# Patient Record
Sex: Male | Born: 1945 | Race: Black or African American | Hispanic: No | Marital: Married | Smoking: Never smoker
Health system: Southern US, Community
[De-identification: ages and names within clinical notes are randomized; demographics above are authoritative.]

## PROBLEM LIST (undated history)

## (undated) DIAGNOSIS — R351 Nocturia: Secondary | ICD-10-CM

## (undated) DIAGNOSIS — I4510 Unspecified right bundle-branch block: Secondary | ICD-10-CM

## (undated) DIAGNOSIS — M1A9XX0 Chronic gout, unspecified, without tophus (tophi): Secondary | ICD-10-CM

## (undated) DIAGNOSIS — Z973 Presence of spectacles and contact lenses: Secondary | ICD-10-CM

## (undated) DIAGNOSIS — C61 Malignant neoplasm of prostate: Secondary | ICD-10-CM

## (undated) DIAGNOSIS — I10 Essential (primary) hypertension: Secondary | ICD-10-CM

## (undated) DIAGNOSIS — E785 Hyperlipidemia, unspecified: Secondary | ICD-10-CM

## (undated) DIAGNOSIS — D696 Thrombocytopenia, unspecified: Secondary | ICD-10-CM

## (undated) HISTORY — PX: OTHER SURGICAL HISTORY: SHX169

## (undated) HISTORY — DX: Essential (primary) hypertension: I10

## (undated) HISTORY — PX: PROSTATE BIOPSY: SHX241

## (undated) HISTORY — PX: NO PAST SURGERIES: SHX2092

## (undated) HISTORY — DX: Thrombocytopenia, unspecified: D69.6

## (undated) HISTORY — DX: Hyperlipidemia, unspecified: E78.5

---

## 2002-07-14 ENCOUNTER — Ambulatory Visit (HOSPITAL_COMMUNITY): Admission: RE | Admit: 2002-07-14 | Discharge: 2002-07-14 | Payer: Self-pay | Admitting: Gastroenterology

## 2002-07-14 ENCOUNTER — Encounter: Payer: Self-pay | Admitting: Gastroenterology

## 2004-04-25 ENCOUNTER — Encounter: Admission: RE | Admit: 2004-04-25 | Discharge: 2004-04-25 | Payer: Self-pay | Admitting: Internal Medicine

## 2005-04-11 ENCOUNTER — Encounter: Admission: RE | Admit: 2005-04-11 | Discharge: 2005-04-11 | Payer: Self-pay | Admitting: Internal Medicine

## 2006-04-09 ENCOUNTER — Encounter: Admission: RE | Admit: 2006-04-09 | Discharge: 2006-04-09 | Payer: Self-pay | Admitting: Internal Medicine

## 2006-09-23 ENCOUNTER — Emergency Department (HOSPITAL_COMMUNITY): Admission: EM | Admit: 2006-09-23 | Discharge: 2006-09-24 | Payer: Self-pay | Admitting: Emergency Medicine

## 2007-04-16 ENCOUNTER — Encounter: Admission: RE | Admit: 2007-04-16 | Discharge: 2007-04-16 | Payer: Self-pay | Admitting: Internal Medicine

## 2007-06-18 ENCOUNTER — Encounter: Admission: RE | Admit: 2007-06-18 | Discharge: 2007-06-18 | Payer: Self-pay | Admitting: Internal Medicine

## 2008-07-09 ENCOUNTER — Encounter: Admission: RE | Admit: 2008-07-09 | Discharge: 2008-07-09 | Payer: Self-pay | Admitting: Internal Medicine

## 2010-05-17 ENCOUNTER — Other Ambulatory Visit: Payer: Self-pay | Admitting: Internal Medicine

## 2010-05-17 ENCOUNTER — Ambulatory Visit
Admission: RE | Admit: 2010-05-17 | Discharge: 2010-05-17 | Disposition: A | Discharge status: Home / Self Care | Payer: Managed Care, Other (non HMO) | Source: Ambulatory Visit | Attending: Internal Medicine | Admitting: Internal Medicine

## 2010-05-17 DIAGNOSIS — J61 Pneumoconiosis due to asbestos and other mineral fibers: Secondary | ICD-10-CM

## 2010-05-17 DIAGNOSIS — D869 Sarcoidosis, unspecified: Secondary | ICD-10-CM

## 2010-05-20 NOTE — Op Note (Signed)
Danny Duran, Danny Duran                            ACCOUNT NO.:  [id number]   MEDICAL RECORD NO.:  [id number]                   PATIENT TYPE:  AMB   LOCATION:  ENDO                                 FACILITY:  [facility]   PHYSICIAN:  Petra Kuba, M.D.                 DATE OF BIRTH:  1945-06-05   DATE OF PROCEDURE:  07/14/2002  DATE OF DISCHARGE:                                 OPERATIVE REPORT   PROCEDURE PERFORMED:  Colonoscopy   INDICATIONS FOR PROCEDURE:  Screening.   INFORMED CONSENT:  Consent was signed after risks, benefits, methods and  options were thoroughly discussed in the office.   MEDICINES USED:  Demerol 70, Versed 7.   DESCRIPTION OF PROCEDURE:  Rectal inspection was pertinent for external  hemorrhoids, small.  Digital exam was negative.  First the video colonoscope  was inserted, easily advanced to the approximate level of the splenic  flexure but possibly to the mid transverse.  Unfortunately there was looping  at that junction and despite rolling him on his back and rolling him on his  right side, and then back on his left side and abdominal pressure, we were  unable to advance any further.  We elected to slowly withdraw.  No  abnormalities were seen.  Anal rectal pull through and retroflexion were  normal at this juncture.  We went ahead and removed the scope and inserted  the pediatric video adjustable colonoscope and were able to get to the same  place; however, with abdominal pressure this time was able to advance  probably to the hepatic flexure.  Unfortunately looping recurred and despite  rolling him on his back and rolling him on his right side we could not  advance any further, so we elected to withdraw.  The prep was adequate.  No  abnormalities were seen on insertion or on withdrawn with the pediatric  adjustable until we withdrew back to the rectum and then the scope was  retroflexed.  Interesting, we did see a tiny probable hyperplastic appearing  polyp  on retroflexion that we did not see when he was on the left side.  Air  was suctioned and scope removed.  The patient tolerated the procedure fairly  well.  There was no obvious immediate complication.   ENDOSCOPIC DIAGNOSES:  1. Internal and external hemorrhoid.  2. Probable tiny hyperplastic appearing rectal polyp seen on retroflexion on     the right side, not seen on the left.  3. Otherwise within normal limits to questionably the hepatic flexure using     the pediatric adjustable.  We did get to possibly the splenic flexure or     the mid transverse using the regular.    PLAN:  Go ahead and get an air contrast barium enema since he is nice and  clean.  If okay and no problems there would recheck in five years.  Yearly  rectals and guaiacs per Dr. Chilton Si.  Happy to see back sooner p.r.n.  Considerations of using the regular adjustable or using Diprivan for better  sedation in the future.                                               Petra Kuba, M.D.    MEM/MEDQ  D:  07/14/2002  T:  07/14/2002  Job:  [id number]   cc:   Erskine Speed, M.D.  [address]  [place]  [place] [address]  Fax: [phone]

## 2010-10-13 LAB — CBC
MCHC: 33.7
MCV: 89.2
Platelets: 245
RDW: 15.2 — ABNORMAL HIGH

## 2010-10-13 LAB — I-STAT 8, (EC8 V) (CONVERTED LAB)
Bicarbonate: 25.5 — ABNORMAL HIGH
Glucose, Bld: 111 — ABNORMAL HIGH
Potassium: 3.5
TCO2: 27
pCO2, Ven: 37.3 — ABNORMAL LOW
pH, Ven: 7.443 — ABNORMAL HIGH

## 2010-10-13 LAB — DIFFERENTIAL
Basophils Absolute: 0.1
Basophils Relative: 1
Eosinophils Absolute: 0.3
Neutro Abs: 5.8
Neutrophils Relative %: 60

## 2010-10-13 LAB — POCT CARDIAC MARKERS
CKMB, poc: 1.2
Myoglobin, poc: 85.2
Operator id: 151321

## 2010-10-13 LAB — POCT I-STAT CREATININE
Creatinine, Ser: 1.2
Operator id: 151321

## 2010-10-13 LAB — D-DIMER, QUANTITATIVE: D-Dimer, Quant: 0.58 — ABNORMAL HIGH

## 2012-09-06 ENCOUNTER — Ambulatory Visit
Admission: RE | Admit: 2012-09-06 | Discharge: 2012-09-06 | Disposition: A | Discharge status: Home / Self Care | Payer: Managed Care, Other (non HMO) | Source: Ambulatory Visit | Attending: Internal Medicine | Admitting: Internal Medicine

## 2012-09-06 ENCOUNTER — Other Ambulatory Visit: Payer: Self-pay | Admitting: Internal Medicine

## 2012-09-06 DIAGNOSIS — D869 Sarcoidosis, unspecified: Secondary | ICD-10-CM

## 2012-09-06 DIAGNOSIS — J61 Pneumoconiosis due to asbestos and other mineral fibers: Secondary | ICD-10-CM

## 2012-12-06 ENCOUNTER — Ambulatory Visit (INDEPENDENT_AMBULATORY_CARE_PROVIDER_SITE_OTHER): Payer: Commercial Indemnity

## 2012-12-06 VITALS — BP 130/81 | HR 72 | Resp 18

## 2012-12-06 DIAGNOSIS — IMO0002 Reserved for concepts with insufficient information to code with codable children: Secondary | ICD-10-CM

## 2012-12-06 DIAGNOSIS — B351 Tinea unguium: Secondary | ICD-10-CM

## 2012-12-06 DIAGNOSIS — L600 Ingrowing nail: Secondary | ICD-10-CM

## 2012-12-06 DIAGNOSIS — S90121S Contusion of right lesser toe(s) without damage to nail, sequela: Secondary | ICD-10-CM

## 2012-12-06 MED ORDER — CEPHALEXIN 500 MG PO CAPS: 500.0000 mg | ORAL_CAPSULE | Freq: Three times a day (TID) | ORAL | Status: DC

## 2012-12-06 NOTE — Patient Instructions (Addendum)
Betadine Soak Instructions  Purchase an 8 oz. bottle of BETADINE solution (Povidone)  THE DAY AFTER THE PROCEDURE  Place 1 tablespoon of betadine solution in a quart of warm tap water.  Submerge your foot or feet with outer bandage intact for the initial soak; this will allow the bandage to become moist and wet for easy lift off.  Once you remove your bandage, continue to soak in the solution for 20 minutes.  This soak should be done twice a day.  Next, remove your foot or feet from solution, blot dry the affected area and cover.  You may use a band aid large enough to cover the area or use gauze and tape.  Apply other medications to the area as directed by the doctor such as cortisporin otic solution (ear drops) or neosporin.  IF YOUR SKIN BECOMES IRRITATED WHILE USING THESE INSTRUCTIONS, IT IS OKAY TO SWITCH TO EPSOM SALTS AND WATER OR WHITE VINEGAR AND WATER.   For postoperative pain recommended Tylenol or ibuprofen take as instructed on the bottle.

## 2012-12-06 NOTE — Progress Notes (Signed)
   Subjective:    Patient ID: Danny Duran, male    DOB: 09/05/1945, 67 y.o.   MRN: [id number]  HPI  My big  toenail on left  has been discolored for about a month and had been burning and throbbing and sore and tender and hurt my nail going up some steps but missed one of the steps about a month ago    Review of Systems  Constitutional: Negative.   HENT: Negative.   Eyes: Negative.   Respiratory: Negative.   Cardiovascular: Negative.   Gastrointestinal: Negative.   Endocrine: Negative.   Genitourinary: Negative.   Musculoskeletal: Negative.   Skin:       Change in nails  Allergic/Immunologic: Negative.   Neurological: Negative.   Hematological: Negative.   Psychiatric/Behavioral: Negative.        Objective:   Physical Exam Neurovascular status is intact with pedal pulses palpable DP and PT +2/4 bilateral Refill time 3 seconds all digits. Neurologically epicritic and proprioceptive sensations intact and symmetric bilateral. Patient does have some darkened discoloration and signs of contusion of the left hallux nail bed. Also some thickening keratosis of the medial lateral borders of the left hallux. Some findings consistent with onychomycosis and lateral border. Patient has a history of partial nail excision right hallux separators back with good success. Orthopedic biomechanical exam otherwise unremarkable noncontributory rectus foot type noted mild digital contractures noted hallux is rectus.       Assessment & Plan:  Ingrowing nail with history of onychomycosis and possible history of contusion with resultant subungual hematoma still present on the left hallux nail bed. Plan at this time patient can do for nail excision medial lateral borders hallux nail which are painful tender and symptomatic and criptotic. Local anesthetic in Mr. to of 3 cc 50-50 mixture of 2% Xylocaine plain and 0.5% Marcaine plain to the left great toe. Betadine prep performed the medial lateral borders  were excised. Pedal matricectomy followed by alcohol wash Silvadene cream and gauze dressing applied to the left great toe. Patient instructed in daily cleansing or Betadine warm water soaks or Epson salt soaks. Plain Tylenol or Advil as needed for pain. As a precaution patient placed on a short-term regimen of antibiotics cephalexin 5 mg 3 times a day x7 days. Future followup will also include possible topical antifungal treatment for fungus nail possibly formula 3 or Fungi-Nail.  Followup in 2 weeks for nail check Alvan Dame DPM

## 2012-12-24 ENCOUNTER — Ambulatory Visit (INDEPENDENT_AMBULATORY_CARE_PROVIDER_SITE_OTHER): Payer: Commercial Indemnity

## 2012-12-24 VITALS — BP 139/93 | HR 66 | Resp 24 | Ht 66.0 in | Wt 228.0 lb

## 2012-12-24 DIAGNOSIS — L600 Ingrowing nail: Secondary | ICD-10-CM

## 2012-12-24 DIAGNOSIS — Z09 Encounter for follow-up examination after completed treatment for conditions other than malignant neoplasm: Secondary | ICD-10-CM

## 2012-12-24 NOTE — Progress Notes (Signed)
   Subjective:    Patient ID: Danny Duran, male    DOB: April 01, 1945, 67 y.o.   MRN: [id number] "It's fine, just about healed but hasn't gotten there yet."  HPI    Review of Systems deferred     Objective:   Physical Exam Neurovascular status is intact pedal pulses palpable epicritic and proprioceptive sensations intact and symmetric. The nail borders show some slight eschar tissue some slight erythema mild serous drainage still present. Patient been applying Neosporin and Band-Aid dressings daily as instructed in daily soaks as instructed. Minimal pain discomfort no ascending cellulitis or lymphangitis.       Assessment & Plan:  Assessment good postop progress nearly healed status post AP nail procedures both borders left great toe. At this time patient is advised maintain Band-Aid and Neosporin during the day we've the Band-Aid off and let it air dry the evenings her at night. Contact the visiting changes or exacerbations occur or followup with in the next month if fails to resolve completely. May discontinue  soaks and bandage once and stopped draining completely  Alvan Dame DPM

## 2012-12-24 NOTE — Patient Instructions (Signed)

## 2013-01-15 ENCOUNTER — Emergency Department (HOSPITAL_COMMUNITY): Payer: Managed Care, Other (non HMO)

## 2013-01-15 ENCOUNTER — Encounter (HOSPITAL_COMMUNITY): Payer: Self-pay | Admitting: Emergency Medicine

## 2013-01-15 ENCOUNTER — Emergency Department (HOSPITAL_COMMUNITY)
Admission: EM | Admit: 2013-01-15 | Discharge: 2013-01-15 | Disposition: A | Discharge status: Home / Self Care | Payer: Managed Care, Other (non HMO) | Attending: Emergency Medicine | Admitting: Emergency Medicine

## 2013-01-15 DIAGNOSIS — Z79899 Other long term (current) drug therapy: Secondary | ICD-10-CM | POA: Insufficient documentation

## 2013-01-15 DIAGNOSIS — W010XXA Fall on same level from slipping, tripping and stumbling without subsequent striking against object, initial encounter: Secondary | ICD-10-CM | POA: Insufficient documentation

## 2013-01-15 DIAGNOSIS — E785 Hyperlipidemia, unspecified: Secondary | ICD-10-CM | POA: Insufficient documentation

## 2013-01-15 DIAGNOSIS — Z7982 Long term (current) use of aspirin: Secondary | ICD-10-CM | POA: Insufficient documentation

## 2013-01-15 DIAGNOSIS — S43015A Anterior dislocation of left humerus, initial encounter: Secondary | ICD-10-CM

## 2013-01-15 DIAGNOSIS — I10 Essential (primary) hypertension: Secondary | ICD-10-CM | POA: Insufficient documentation

## 2013-01-15 DIAGNOSIS — Y9289 Other specified places as the place of occurrence of the external cause: Secondary | ICD-10-CM | POA: Insufficient documentation

## 2013-01-15 DIAGNOSIS — W009XXA Unspecified fall due to ice and snow, initial encounter: Secondary | ICD-10-CM

## 2013-01-15 DIAGNOSIS — Y939 Activity, unspecified: Secondary | ICD-10-CM | POA: Insufficient documentation

## 2013-01-15 DIAGNOSIS — S43016A Anterior dislocation of unspecified humerus, initial encounter: Secondary | ICD-10-CM | POA: Insufficient documentation

## 2013-01-15 MED ORDER — KETOROLAC TROMETHAMINE 60 MG/2ML IM SOLN
60.0000 mg | Freq: Once | INTRAMUSCULAR | Status: AC
  Administered 2013-01-15: 60 mg via INTRAMUSCULAR
  Filled 2013-01-15: qty 2

## 2013-01-15 MED ORDER — MORPHINE SULFATE 4 MG/ML IJ SOLN
4.0000 mg | Freq: Once | INTRAMUSCULAR | Status: AC
  Administered 2013-01-15: 4 mg via INTRAMUSCULAR
  Filled 2013-01-15: qty 1

## 2013-01-15 MED ORDER — KETAMINE HCL 10 MG/ML IJ SOLN: 100.0000 mg | Freq: Once | INTRAMUSCULAR | Status: DC

## 2013-01-15 MED ORDER — METHOCARBAMOL 500 MG PO TABS: 500.0000 mg | ORAL_TABLET | Freq: Two times a day (BID) | ORAL | Status: DC

## 2013-01-15 MED ORDER — PROPOFOL 10 MG/ML IV BOLUS: 100.0000 mg | Freq: Once | INTRAVENOUS | Status: DC

## 2013-01-15 MED ORDER — OXYCODONE-ACETAMINOPHEN 5-325 MG PO TABS
2.0000 | ORAL_TABLET | Freq: Once | ORAL | Status: AC
  Administered 2013-01-15: 2 via ORAL
  Filled 2013-01-15: qty 2

## 2013-01-15 MED ORDER — OXYCODONE-ACETAMINOPHEN 5-325 MG PO TABS: 2.0000 | ORAL_TABLET | ORAL | Status: DC | PRN

## 2013-01-15 NOTE — ED Notes (Signed)
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

## 2013-01-15 NOTE — ED Provider Notes (Addendum)
Medical screening examination/treatment/procedure(s) were conducted as a shared visit with non-physician practitioner(s) and myself.  I personally evaluated the patient during the encounter.  EKG Interpretation   None      Patient here with anterior shoulder dislocation on the left side. Patients shoulder reduced with traction and countertraction without sedation. Post reduction films show good shoulder alignment. Swelling applied by nursing. Pain meds given. Neurological exam stable.  Leota Jacobsen, MD 01/15/13 [place], MD 01/15/13 1324

## 2013-01-15 NOTE — ED Provider Notes (Signed)
Medical screening examination/treatment/procedure(s) were performed by non-physician practitioner and as supervising physician I was immediately available for consultation/collaboration.  EKG Interpretation   None        Merryl Hacker, MD 01/15/13 [phone]

## 2013-01-15 NOTE — ED Provider Notes (Signed)
CSN: [id number]     Arrival date & time 01/15/13  0277 History  This chart was scribed for non-physician practitioner Noland Fordyce, PA-C working with Merryl Hacker, MD by Rolanda Lundborg, ED Scribe. This patient was seen in room TR07C/TR07C and the patient's care was started at 9:39 AM.    Chief Complaint  Patient presents with  . Fall  . Arm Pain   The history is provided by the patient. No language interpreter was used.   HPI Comments: Danny Duran is a 68 y.o. male who presents to the Emergency Department complaining of constant left shoulder pain since slipping and falling on the icy ground and landing on his shoulder this morning. He states he landed with his arm back behind him. He denies hitting his head or LOC. He is left handed. He denies h/o shoulder injury. He denies elbow pain, wrist pain, numbness. He has no allergies to medications.   Past Medical History  Diagnosis Date  . Hyperlipidemia   . Hypertension    Past Surgical History  Procedure Laterality Date  . Ingrown toenail Left     HALLUX   Family History  Problem Relation Age of Onset  . Heart disease Mother   . Heart disease Father    History  Substance Use Topics  . Smoking status: Never Smoker   . Smokeless tobacco: Never Used  . Alcohol Use: Yes    Review of Systems  Musculoskeletal: Positive for arthralgias (left shoulder).  Neurological: Negative for syncope and numbness.  All other systems reviewed and are negative.    Allergies  Review of patient's allergies indicates no known allergies.  Home Medications   Current Outpatient Rx  Name  Route  Sig  Dispense  Refill  . amlodipine-atorvastatin (CADUET) 10-20 MG per tablet   Oral   Take 1 tablet by mouth daily.          Marland Kitchen aspirin EC 81 MG tablet   Oral   Take 81 mg by mouth daily.         . carvedilol (COREG) 25 MG tablet   Oral   Take 25 mg by mouth 2 (two) times daily with a meal.          . doxazosin (CARDURA) 8 MG  tablet   Oral   Take 8 mg by mouth daily.          . Multiple Vitamin (MULTIVITAMIN WITH MINERALS) TABS tablet   Oral   Take 1 tablet by mouth daily.         Marland Kitchen spironolactone (ALDACTONE) 50 MG tablet   Oral   Take 50 mg by mouth daily.          . valsartan-hydrochlorothiazide (DIOVAN-HCT) 320-25 MG per tablet   Oral   Take 1 tablet by mouth daily.           BP 138/89  Pulse 67  Temp(Src) 98 F (36.7 C) (Oral)  Resp 24  SpO2 97% Physical Exam  Nursing note and vitals reviewed. Constitutional: He appears well-developed and well-nourished.  HENT:  Head: Normocephalic and atraumatic.  Eyes: Conjunctivae are normal. No scleral icterus.  Neck: Normal range of motion.  Cardiovascular: Normal rate, regular rhythm and normal heart sounds.   Pulmonary/Chest: Effort normal and breath sounds normal. No respiratory distress. He has no wheezes. He has no rales. He exhibits no tenderness.  Abdominal: Soft.  Musculoskeletal: Normal range of motion.  Deformity of left shoulder, unable to touch right shoulder  with left hand. Hesitant to move arm due to pain. No tenderness left elbow or left wrist. Radial pulse 2+ cap refill less than 2. Normal sensation to light touch. 4/5 grip strength.   Neurological: He is alert.  Skin: Skin is warm and dry.  Intact no ecchymosis or erythema    ED Course  Procedures (including critical care time) Medications  morphine 4 MG/ML injection 4 mg (4 mg Intramuscular Given 01/15/13 1011)  ketorolac (TORADOL) injection 60 mg (60 mg Intramuscular Given 01/15/13 1143)  morphine 4 MG/ML injection 4 mg (4 mg Intramuscular Given 01/15/13 1143)  oxyCODONE-acetaminophen (PERCOCET/ROXICET) 5-325 MG per tablet 2 tablet (2 tablets Oral Given 01/15/13 1250)    DIAGNOSTIC STUDIES: Oxygen Saturation is 97% on RA, normal by my interpretation.    COORDINATION OF CARE: 10:08 AM- Discussed treatment plan with pt which includes x-ray and morphine. Pt agrees to  plan.    Labs Review Labs Reviewed - No data to display Imaging Review Dg Shoulder Left  01/15/2013   CLINICAL DATA:  Fall.  EXAM: LEFT SHOULDER - 2+ VIEW  COMPARISON:  None.  FINDINGS: There is an anterior subcoracoid dislocation of the left shoulder. No fracture identified.  IMPRESSION: Anterior subcoracoid dislocation left shoulder.   Electronically Signed   By: Marcello Moores  Register   On: 01/15/2013 10:33    EKG Interpretation   None       MDM   1. Fall due to ice or snow   2. Anterior dislocation of left shoulder    Pt slipped on ice earlier this morning causing pt to fall on left arm fully extended. Immediate pain. Tx in ED: morphine and toradol given for pain.  Plain films: anterior subcoracoid dislocation of left shoulder.  Pt signed over to Dr. Vivi Martens for further evaluation and reduction of left shoulder as resources not available in [facility] setting.  I personally performed the services described in this documentation, which was scribed in my presence. The recorded information has been reviewed and is accurate.   Noland Fordyce, PA-C 01/15/13 1308

## 2013-01-15 NOTE — ED Notes (Signed)
Discharge and follow up reviewed with pt. Pt verbalized understanding.  

## 2013-01-15 NOTE — Discharge Instructions (Signed)
Arm Sling Use A sling is used to:  Limit how much your arm moves.  Make you more comfortable.  Support your arm. The sling fits well if:  Your elbow rests in the bottom and corner pocket.  Only your fingers show at the opening. Your wrist should fit inside and be supported by the sling.  The strap goes around your shoulder or neck for support.  Your arm is fairly level with your hand, slightly higher than your elbow. HOME CARE   Adjust the sling to keep the hand inside. Slings tend to slip, making the elbow point up. Tug the elbow back into place.  The fingers should feel warm and be a normal color.  Try to keep the palm of the hand toward the body while wearing the sling.  Take the sling off when going to sleep if this is okay with your doctor.  Use an extra pillow at night to protect the arm. Slide the arm between a pillow and the cover.  Take baths or showers as told by your doctor.  Only take medicine as told by your doctor. GET HELP RIGHT AWAY IF:   The fingers turn cold or start to tingle.  The arm pain gets worse.  The pain is not helped by medicine or by adjusting the sling. MAKE SURE YOU:   Understand these instructions.  Will watch this condition.  Will get help right away if you are not doing well or get worse. Document Released: 06/07/2007 Document Revised: 03/13/2011 Document Reviewed: 06/07/2007 [facility] Patient Information 2014 [place], [place]. Shoulder Dislocation Your shoulder is made up of three bones: the collar bone (clavicle); the shoulder blade (scapula), which includes the socket (glenoid cavity); and the upper arm bone (humerus). Your shoulder joint is the place where these bones meet. Strong, fibrous tissues hold these bones together (ligaments). Muscles and strong, fibrous tissues that connect the muscles to these bones (tendons) allow your arm to move through this joint. The range of motion of your shoulder joint is more extensive than most  of your other joints, and the glenoid cavity is very shallow. That is the reason that your shoulder joint is one of the most unstable joints in your body. It is far more prone to dislocation than your other joints. Shoulder dislocation is when your humerus is forced out of your shoulder joint. CAUSES Shoulder dislocation is caused by a forceful impact on your shoulder. This impact usually is from an injury, such as a sports injury or a fall. SYMPTOMS Symptoms of shoulder dislocation include:  Deformity of your shoulder.  Intense pain.  Inability to move your shoulder joint.  Numbness, weakness, or tingling around your shoulder joint (your neck or down your arm).  Bruising or swelling around your shoulder. DIAGNOSIS In order to diagnose a dislocated shoulder, your caregiver will perform a physical exam. Your caregiver also may have an X-ray exam done to see if you have any broken bones. Magnetic resonance imaging (MRI) is a procedure that sometimes is done to help your caregiver see any damage to the soft tissues around your shoulder, particularly your rotator cuff tendons. Additionally, your caregiver also may have electromyography done to measure the electrical discharges produced in your muscles if you have signs or symptoms of nerve damage. TREATMENT A shoulder dislocation is treated by placing the humerus back in the joint (reduction). Your caregiver does this either manually (closed reduction), by moving your humerus back into the joint through manipulation, or through surgery (open  reduction). When your humerus is back in place, severe pain should improve almost immediately. You also may need to have surgery if you have a weak shoulder joint or ligaments, and you have recurring shoulder dislocations, despite rehabilitation. In rare cases, surgery is necessary if your nerves or blood vessels are damaged during the dislocation. After your reduction, your arm will be placed in a shoulder  immobilizer or sling to keep it from moving. Your caregiver will have you wear your shoulder immoblizer or sling for 3 days to 3 weeks, depending on how serious your dislocation is. When your shoulder immobilizer or sling is removed, your caregiver may prescribe physical therapy to help improve the range of motion in your shoulder joint. HOME CARE INSTRUCTIONS  The following measures can help to reduce pain and speed up the healing process:  Rest your injured joint. Do not move it. Avoid activities similar to the one that caused your injury.  Apply ice to your injured joint for the first day or two after your reduction or as directed by your caregiver. Applying ice helps to reduce inflammation and pain.  Put ice in a plastic bag.  Place a towel between your skin and the bag.  Leave the ice on for 15-20 minutes at a time, every 2 hours while you are awake.  Exercise your hand by squeezing a soft ball. This helps to eliminate stiffness and swelling in your hand and wrist.  Take over-the-counter or prescription medicine for pain or discomfort as told by your caregiver. SEEK IMMEDIATE MEDICAL CARE IF:   Your shoulder immobilizer or sling becomes damaged.  Your pain becomes worse rather than better.  You lose feeling in your arm or hand, or they become white and cold. MAKE SURE YOU:   Understand these instructions.  Will watch your condition.  Will get help right away if you are not doing well or get worse. Document Released: 09/13/2000 Document Revised: 03/13/2011 Document Reviewed: 10/09/2010 [facility] Patient Information 2014 [place], [place].

## 2013-09-10 ENCOUNTER — Ambulatory Visit
Admission: RE | Admit: 2013-09-10 | Discharge: 2013-09-10 | Disposition: A | Discharge status: Home / Self Care | Payer: Commercial Managed Care - HMO | Source: Ambulatory Visit | Attending: Internal Medicine | Admitting: Internal Medicine

## 2013-09-10 ENCOUNTER — Other Ambulatory Visit: Payer: Self-pay | Admitting: Internal Medicine

## 2013-09-10 DIAGNOSIS — Z77090 Contact with and (suspected) exposure to asbestos: Secondary | ICD-10-CM

## 2013-09-10 DIAGNOSIS — Z862 Personal history of diseases of the blood and blood-forming organs and certain disorders involving the immune mechanism: Secondary | ICD-10-CM

## 2014-06-29 ENCOUNTER — Ambulatory Visit: Payer: Commercial Indemnity | Admitting: Podiatry

## 2014-10-06 ENCOUNTER — Other Ambulatory Visit: Payer: Self-pay | Admitting: Internal Medicine

## 2014-10-06 ENCOUNTER — Ambulatory Visit
Admission: RE | Admit: 2014-10-06 | Discharge: 2014-10-06 | Disposition: A | Discharge status: Home / Self Care | Payer: Commercial Managed Care - HMO | Source: Ambulatory Visit | Attending: Internal Medicine | Admitting: Internal Medicine

## 2014-10-06 DIAGNOSIS — D869 Sarcoidosis, unspecified: Secondary | ICD-10-CM

## 2015-01-27 IMAGING — CR DG SHOULDER 2+V*L*
2 series · 2 of 2 positions shown · non-contrast
Comparison: None.

CLINICAL DATA: Fall.

EXAM:
LEFT SHOULDER - 2+ VIEW

[w shoulder external left]
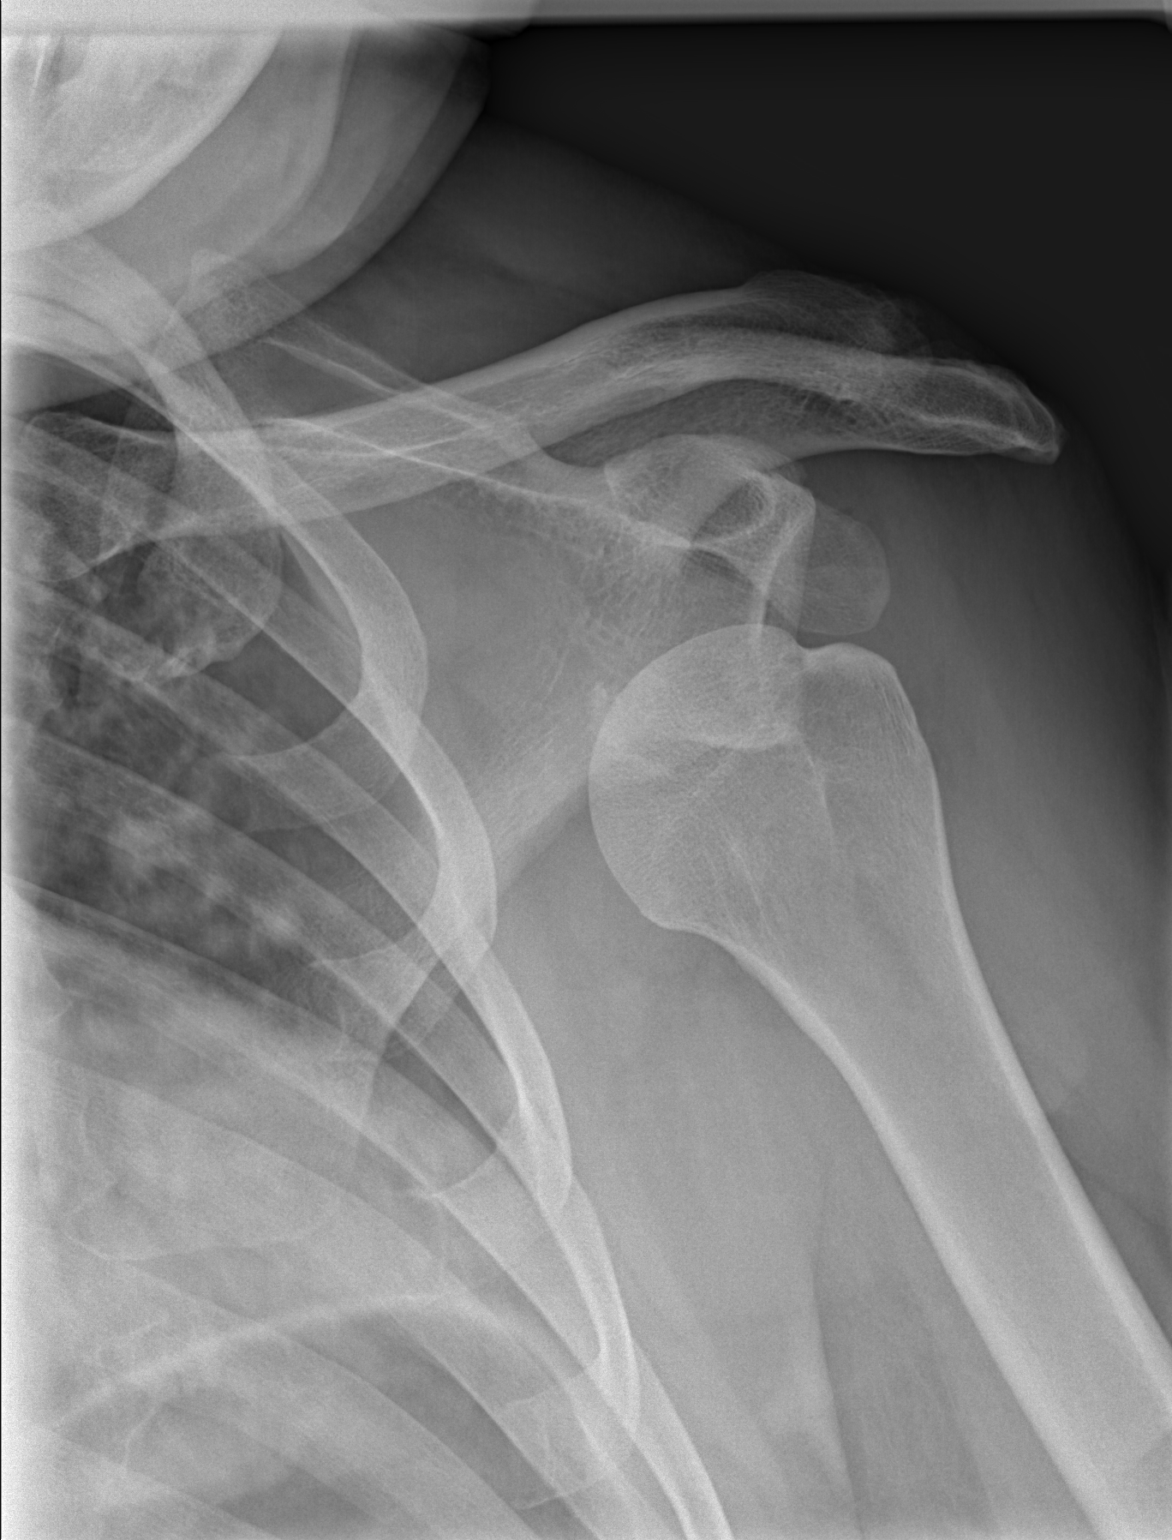

[w shoulder y-view left]
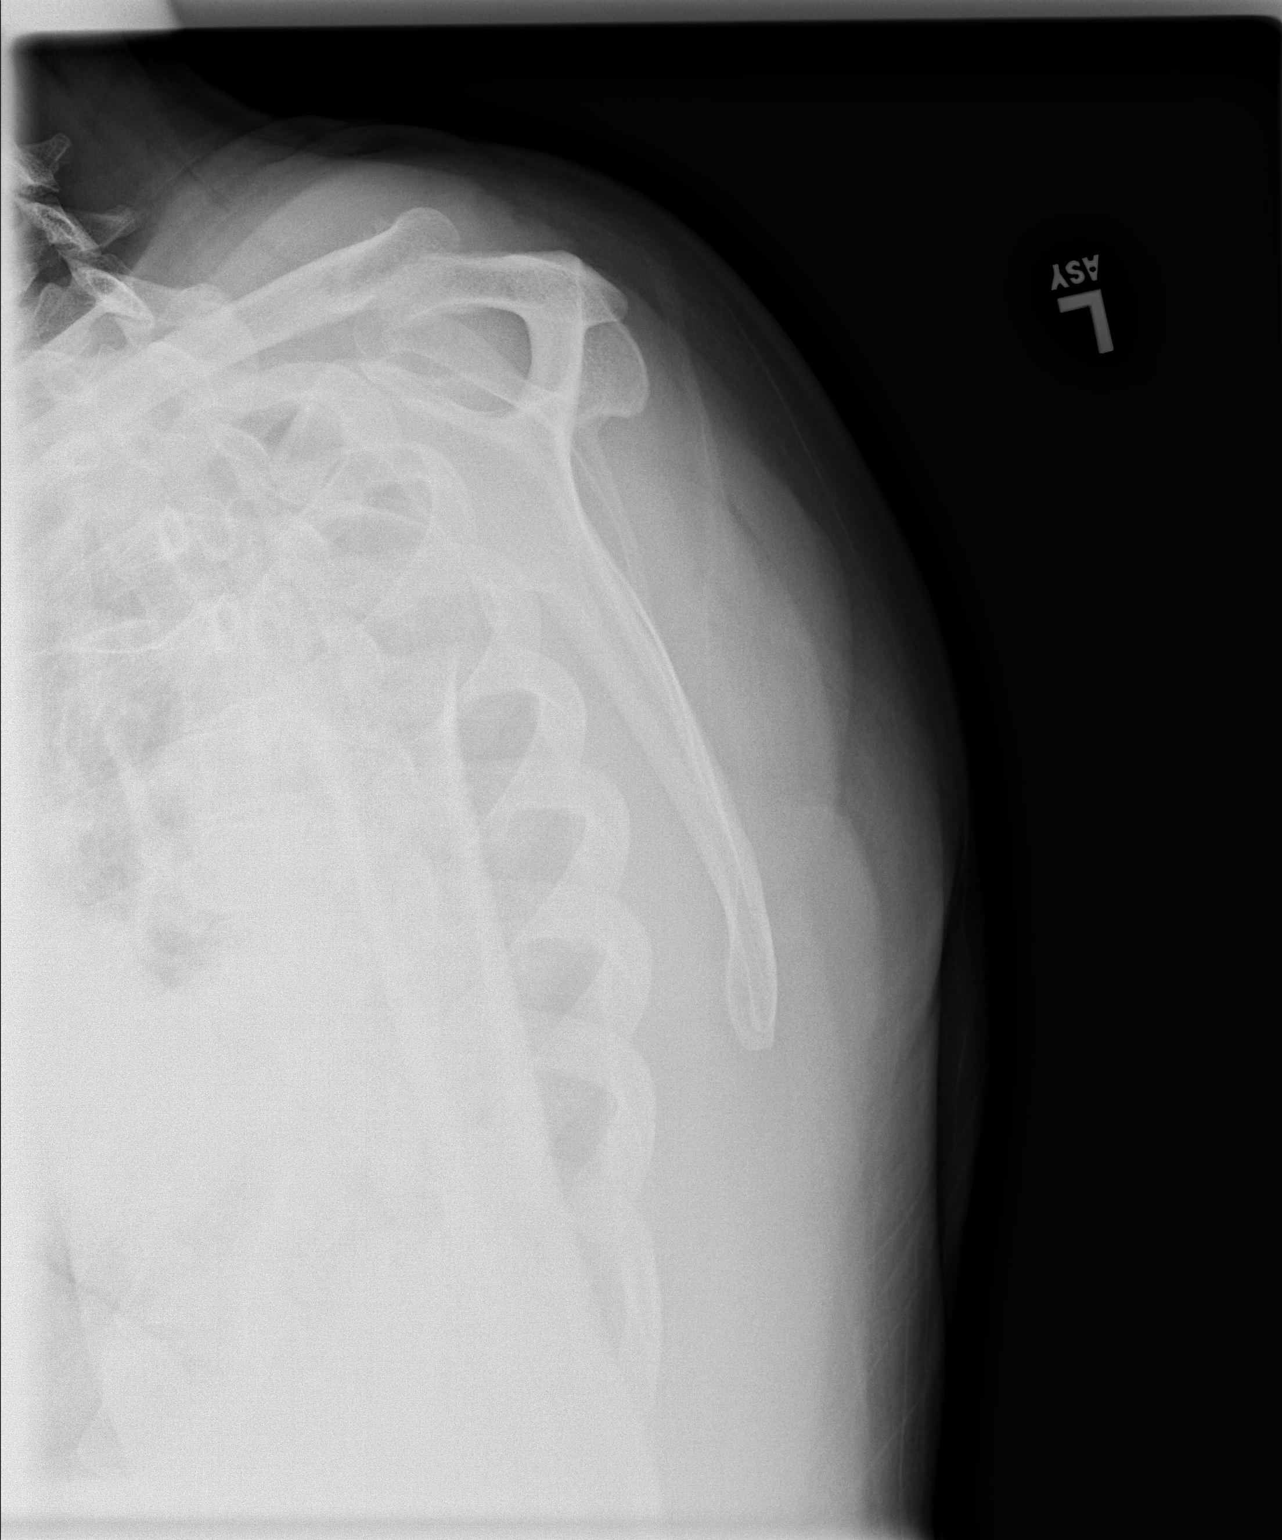

[2 of 2 positions shown; findings below may reference images not displayed]

FINDINGS: There is an anterior subcoracoid dislocation of the left shoulder.
No fracture identified.
IMPRESSION: Anterior subcoracoid dislocation left shoulder.

## 2015-02-17 DIAGNOSIS — E039 Hypothyroidism, unspecified: Secondary | ICD-10-CM | POA: Diagnosis not present

## 2015-02-17 DIAGNOSIS — I10 Essential (primary) hypertension: Secondary | ICD-10-CM | POA: Diagnosis not present

## 2015-05-03 DIAGNOSIS — I119 Hypertensive heart disease without heart failure: Secondary | ICD-10-CM | POA: Diagnosis not present

## 2015-06-23 DIAGNOSIS — H52203 Unspecified astigmatism, bilateral: Secondary | ICD-10-CM | POA: Diagnosis not present

## 2015-06-23 DIAGNOSIS — H524 Presbyopia: Secondary | ICD-10-CM | POA: Diagnosis not present

## 2015-06-23 DIAGNOSIS — H2513 Age-related nuclear cataract, bilateral: Secondary | ICD-10-CM | POA: Diagnosis not present

## 2015-08-10 DIAGNOSIS — I10 Essential (primary) hypertension: Secondary | ICD-10-CM | POA: Diagnosis not present

## 2015-08-10 DIAGNOSIS — Z0000 Encounter for general adult medical examination without abnormal findings: Secondary | ICD-10-CM | POA: Diagnosis not present

## 2015-08-10 DIAGNOSIS — E7800 Pure hypercholesterolemia, unspecified: Secondary | ICD-10-CM | POA: Diagnosis not present

## 2015-08-10 DIAGNOSIS — K51419 Inflammatory polyps of colon with unspecified complications: Secondary | ICD-10-CM | POA: Diagnosis not present

## 2015-08-10 DIAGNOSIS — R972 Elevated prostate specific antigen [PSA]: Secondary | ICD-10-CM | POA: Diagnosis not present

## 2015-11-09 ENCOUNTER — Ambulatory Visit (INDEPENDENT_AMBULATORY_CARE_PROVIDER_SITE_OTHER): Payer: Medicare Other | Admitting: Family Medicine

## 2015-11-09 VITALS — BP 138/90 | HR 81 | Temp 98.6°F | Resp 16 | Ht 71.0 in | Wt 240.0 lb

## 2015-11-09 DIAGNOSIS — J209 Acute bronchitis, unspecified: Secondary | ICD-10-CM

## 2015-11-09 MED ORDER — ALBUTEROL SULFATE 108 (90 BASE) MCG/ACT IN AEPB
2.0000 | INHALATION_SPRAY | RESPIRATORY_TRACT | 0 refills | Status: DC | PRN
Start: 2015-11-09 — End: 2019-05-27

## 2015-11-09 MED ORDER — ALBUTEROL SULFATE (2.5 MG/3ML) 0.083% IN NEBU
2.5000 mg | INHALATION_SOLUTION | Freq: Once | RESPIRATORY_TRACT | Status: AC
  Administered 2015-11-09: 2.5 mg via RESPIRATORY_TRACT

## 2015-11-09 MED ORDER — BENZONATATE 200 MG PO CAPS: 200.0000 mg | ORAL_CAPSULE | Freq: Three times a day (TID) | ORAL | 0 refills | Status: DC | PRN

## 2015-11-09 MED ORDER — GUAIFENESIN ER 1200 MG PO TB12: 1.0000 | ORAL_TABLET | Freq: Two times a day (BID) | ORAL | 1 refills | Status: DC | PRN

## 2015-11-09 NOTE — Patient Instructions (Addendum)
   IF you received an x-ray today, you will receive an invoice from [facility]. Please contact [facility] at [phone] with questions or concerns regarding your invoice.   IF you received labwork today, you will receive an invoice from [facility]. Please contact [facility] at [phone] with questions or concerns regarding your invoice.   Our billing staff will not be able to assist you with questions regarding bills from these companies.  You will be contacted with the lab results as soon as they are available. The fastest way to get your results is to activate your My Chart account. Instructions are located on the last page of this paperwork. If you have not heard from us regarding the results in 2 weeks, please contact this office.    Acute Bronchitis Bronchitis is inflammation of the airways that extend from the windpipe into the lungs (bronchi). The inflammation often causes mucus to develop. This leads to a cough, which is the most common symptom of bronchitis.  In acute bronchitis, the condition usually develops suddenly and goes away over time, usually in a couple weeks. Smoking, allergies, and asthma can make bronchitis worse. Repeated episodes of bronchitis may cause further lung problems.  CAUSES Acute bronchitis is most often caused by the same virus that causes a cold. The virus can spread from person to person (contagious) through coughing, sneezing, and touching contaminated objects. SIGNS AND SYMPTOMS   Cough.   Fever.   Coughing up mucus.   Body aches.   Chest congestion.   Chills.   Shortness of breath.   Sore throat.  DIAGNOSIS  Acute bronchitis is usually diagnosed through a physical exam. Your health care provider will also ask you questions about your medical history. Tests, such as chest X-rays, are sometimes done to rule out other conditions.  TREATMENT  Acute bronchitis usually goes away in a  couple weeks. Oftentimes, no medical treatment is necessary. Medicines are sometimes given for relief of fever or cough. Antibiotic medicines are usually not needed but may be prescribed in certain situations. In some cases, an inhaler may be recommended to help reduce shortness of breath and control the cough. A cool mist vaporizer may also be used to help thin bronchial secretions and make it easier to clear the chest.  HOME CARE INSTRUCTIONS  Get plenty of rest.   Drink enough fluids to keep your urine clear or pale yellow (unless you have a medical condition that requires fluid restriction). Increasing fluids may help thin your respiratory secretions (sputum) and reduce chest congestion, and it will prevent dehydration.   Take medicines only as directed by your health care provider.  If you were prescribed an antibiotic medicine, finish it all even if you start to feel better.  Avoid smoking and secondhand smoke. Exposure to cigarette smoke or irritating chemicals will make bronchitis worse. If you are a smoker, consider using nicotine gum or skin patches to help control withdrawal symptoms. Quitting smoking will help your lungs heal faster.   Reduce the chances of another bout of acute bronchitis by washing your hands frequently, avoiding people with cold symptoms, and trying not to touch your hands to your mouth, nose, or eyes.   Keep all follow-up visits as directed by your health care provider.  SEEK MEDICAL CARE IF: Your symptoms do not improve after 1 week of treatment.  SEEK IMMEDIATE MEDICAL CARE IF:  You develop an increased fever or chills.   You have chest pain.     You have severe shortness of breath.  You have bloody sputum.   You develop dehydration.  You faint or repeatedly feel like you are going to pass out.  You develop repeated vomiting.  You develop a severe headache. MAKE SURE YOU:   Understand these instructions.  Will watch your  condition.  Will get help right away if you are not doing well or get worse.   This information is not intended to replace advice given to you by your health care provider. Make sure you discuss any questions you have with your health care provider.   Document Released: 01/27/2004 Document Revised: 01/09/2014 Document Reviewed: 06/11/2012 Elsevier Interactive Patient Education 2016 Elsevier Inc.  

## 2015-11-09 NOTE — Progress Notes (Signed)
Subjective:  By signing my name below, I, Danny Duran, attest that this documentation has been prepared under the direction and in the presence of Delman Cheadle, MD Electronically Signed: Ladene Artist, ED Scribe 11/09/2015 at 10:28 AM.   Patient ID: Danny Duran, male    DOB: 17-Jan-1945, 70 y.o.   MRN: [id number]  Chief Complaint  Patient presents with  . chest congestion    x 1 week   . Cough    pt say coughing has made side hurt    HPI HPI Comments: Danny Duran is a 70 y.o. male who presents to the [facility] complaining of ongoing dry cough for the past week. Pt states that cough seems to wake him up at night and occasionally causes his side to hurt. He reports associated chest congestion that he is having a difficult time expelling. Pt has tried coricidin hbp for cough with temporary relief. He denies fever, chills, sob. Pt denies h/o asthma or seasonal allergies. Pt is a nonsmoker.   Past Medical History:  Diagnosis Date  . Hyperlipidemia   . Hypertension    Current Outpatient Prescriptions on File Prior to Visit  Medication Sig Dispense Refill  . aspirin EC 81 MG tablet Take 81 mg by mouth daily.    . carvedilol (COREG) 25 MG tablet Take 25 mg by mouth 2 (two) times daily with a meal.     . doxazosin (CARDURA) 8 MG tablet Take 8 mg by mouth daily.     Marland Kitchen spironolactone (ALDACTONE) 50 MG tablet Take 25 mg by mouth daily.     . valsartan-hydrochlorothiazide (DIOVAN-HCT) 320-25 MG per tablet Take 1 tablet by mouth daily.      No current facility-administered medications on file prior to visit.    Not on File  Review of Systems  Constitutional: Negative for chills and fever.  Respiratory: Positive for cough. Negative for shortness of breath.   Allergic/Immunologic: Negative for environmental allergies.   BP 138/90 (BP Location: Right Arm, Patient Position: Sitting, Cuff Size: Large)   Pulse 81   Temp 98.6 F (37 C) (Oral)   Resp 16   Ht 5\' 11"   (1.803 m)   Wt 240 lb (108.9 kg)   SpO2 97%   BMI 33.47 kg/m     Objective:   Physical Exam  Constitutional: He is oriented to person, place, and time. He appears well-developed and well-nourished. No distress.  HENT:  Head: Normocephalic and atraumatic.  Right Ear: A middle ear effusion (Minimal to moderate) is present.  Left Ear: A middle ear effusion (Minimal to moderate) is present.  Mouth/Throat: Oropharynx is clear and moist.  Nares with some erythema.   Eyes: Conjunctivae and EOM are normal.  Neck: Neck supple. No tracheal deviation present.  Cardiovascular: Normal rate, regular rhythm, S1 normal, S2 normal and normal heart sounds.   Pulmonary/Chest: Effort normal and breath sounds normal. No respiratory distress.  Lungs are clear to auscultation.   Musculoskeletal: Normal range of motion.  Lymphadenopathy:       Head (right side): Submandibular and tonsillar adenopathy present.       Head (left side): Submandibular and tonsillar adenopathy present.  Neurological: He is alert and oriented to person, place, and time.  Skin: Skin is warm and dry.  Psychiatric: He has a normal mood and affect. His behavior is normal.  Nursing note and vitals reviewed.  Recheck after albuterol neb: Improved air movement. Symptomatically improved.  Assessment & Plan:   1. Acute bronchitis, unspecified organism     Meds ordered this encounter  Medications  . albuterol (PROVENTIL) (2.5 MG/3ML) 0.083% nebulizer solution 2.5 mg  . Guaifenesin (MUCINEX MAXIMUM STRENGTH) 1200 MG TB12    Sig: Take 1 tablet (1,200 mg total) by mouth every 12 (twelve) hours as needed.    Dispense:  14 tablet    Refill:  1  . benzonatate (TESSALON) 200 MG capsule    Sig: Take 1 capsule (200 mg total) by mouth 3 (three) times daily as needed for cough.    Dispense:  40 capsule    Refill:  0  . Albuterol Sulfate (PROAIR RESPICLICK) 123XX123 (90 Base) MCG/ACT AEPB    Sig: Inhale 2 puffs into the lungs every 4  (four) hours as needed.    Dispense:  1 each    Refill:  0    Ok to substitute for whatever albuterol inhaler is best with insurance.  Please teach pt how to use.    I personally performed the services described in this documentation, which was scribed in my presence. The recorded information has been reviewed and considered, and addended by me as needed.   Delman Cheadle, M.D.  [facility] [address] [place], [place] [address] [phone] phone [phone] fax  11/16/15 11:11 PM

## 2015-11-11 DIAGNOSIS — Z77090 Contact with and (suspected) exposure to asbestos: Secondary | ICD-10-CM | POA: Diagnosis not present

## 2015-11-11 DIAGNOSIS — J209 Acute bronchitis, unspecified: Secondary | ICD-10-CM | POA: Diagnosis not present

## 2015-11-11 DIAGNOSIS — I10 Essential (primary) hypertension: Secondary | ICD-10-CM | POA: Diagnosis not present

## 2015-12-02 DIAGNOSIS — D8687 Sarcoid myositis: Secondary | ICD-10-CM | POA: Diagnosis not present

## 2015-12-02 DIAGNOSIS — I10 Essential (primary) hypertension: Secondary | ICD-10-CM | POA: Diagnosis not present

## 2016-01-11 ENCOUNTER — Ambulatory Visit
Admission: RE | Admit: 2016-01-11 | Discharge: 2016-01-11 | Disposition: A | Discharge status: Home / Self Care | Payer: Medicare Other | Source: Ambulatory Visit | Attending: Internal Medicine | Admitting: Internal Medicine

## 2016-01-11 ENCOUNTER — Other Ambulatory Visit: Payer: Self-pay | Admitting: Internal Medicine

## 2016-01-11 DIAGNOSIS — Z8709 Personal history of other diseases of the respiratory system: Secondary | ICD-10-CM

## 2016-05-22 ENCOUNTER — Ambulatory Visit (INDEPENDENT_AMBULATORY_CARE_PROVIDER_SITE_OTHER): Payer: Medicare Other | Admitting: Podiatry

## 2016-05-22 ENCOUNTER — Encounter: Payer: Self-pay | Admitting: Podiatry

## 2016-05-22 DIAGNOSIS — L600 Ingrowing nail: Secondary | ICD-10-CM

## 2016-05-22 MED ORDER — HYDROCODONE-ACETAMINOPHEN 10-325 MG PO TABS: 1.0000 | ORAL_TABLET | Freq: Three times a day (TID) | ORAL | 0 refills | Status: DC | PRN

## 2016-05-22 NOTE — Patient Instructions (Signed)

## 2016-05-24 NOTE — Progress Notes (Signed)
Subjective:    Patient ID: Danny Duran, male   DOB: 71 y.o.   MRN: [id number]   HPI patient presents stating he has a painful ingrown toenail on his left big toe and it's making shoe gear difficult    Review of Systems  All other systems reviewed and are negative.       Objective:  Physical Exam  Cardiovascular: Intact distal pulses.   Musculoskeletal: Normal range of motion.  Neurological: He is alert.  Skin: Skin is warm.  Nursing note and vitals reviewed.  neurovascular status intact muscle strength adequate range of motion within normal limits with patient noted to have an incurvated left hallux medial border that's painful when pressed with distal redness but no active drainage noted. Patient's found to have good digital perfusion and well oriented     Assessment:   Chronic ingrown toenail deformity left hallux     Plan:    H&P condition reviewed and recommended correction of deformity. Explained procedure and risk and today I infiltrated the left hallux 60 mg like Marcaine mixture remove the border exposed matrix and applied phenol 3 applications 30 seconds followed by local lavage sterile dressing. Gave instructions on soaks and reappoint

## 2016-10-17 IMAGING — CR DG CHEST 2V
2 series · 2 of 2 positions shown · non-contrast
Comparison: PA and lateral chest of September 10, 2013

CLINICAL DATA: History of sarcoidosis and hypertension, nonsmoker

EXAM:
CHEST  2 VIEW

[w chest pa]
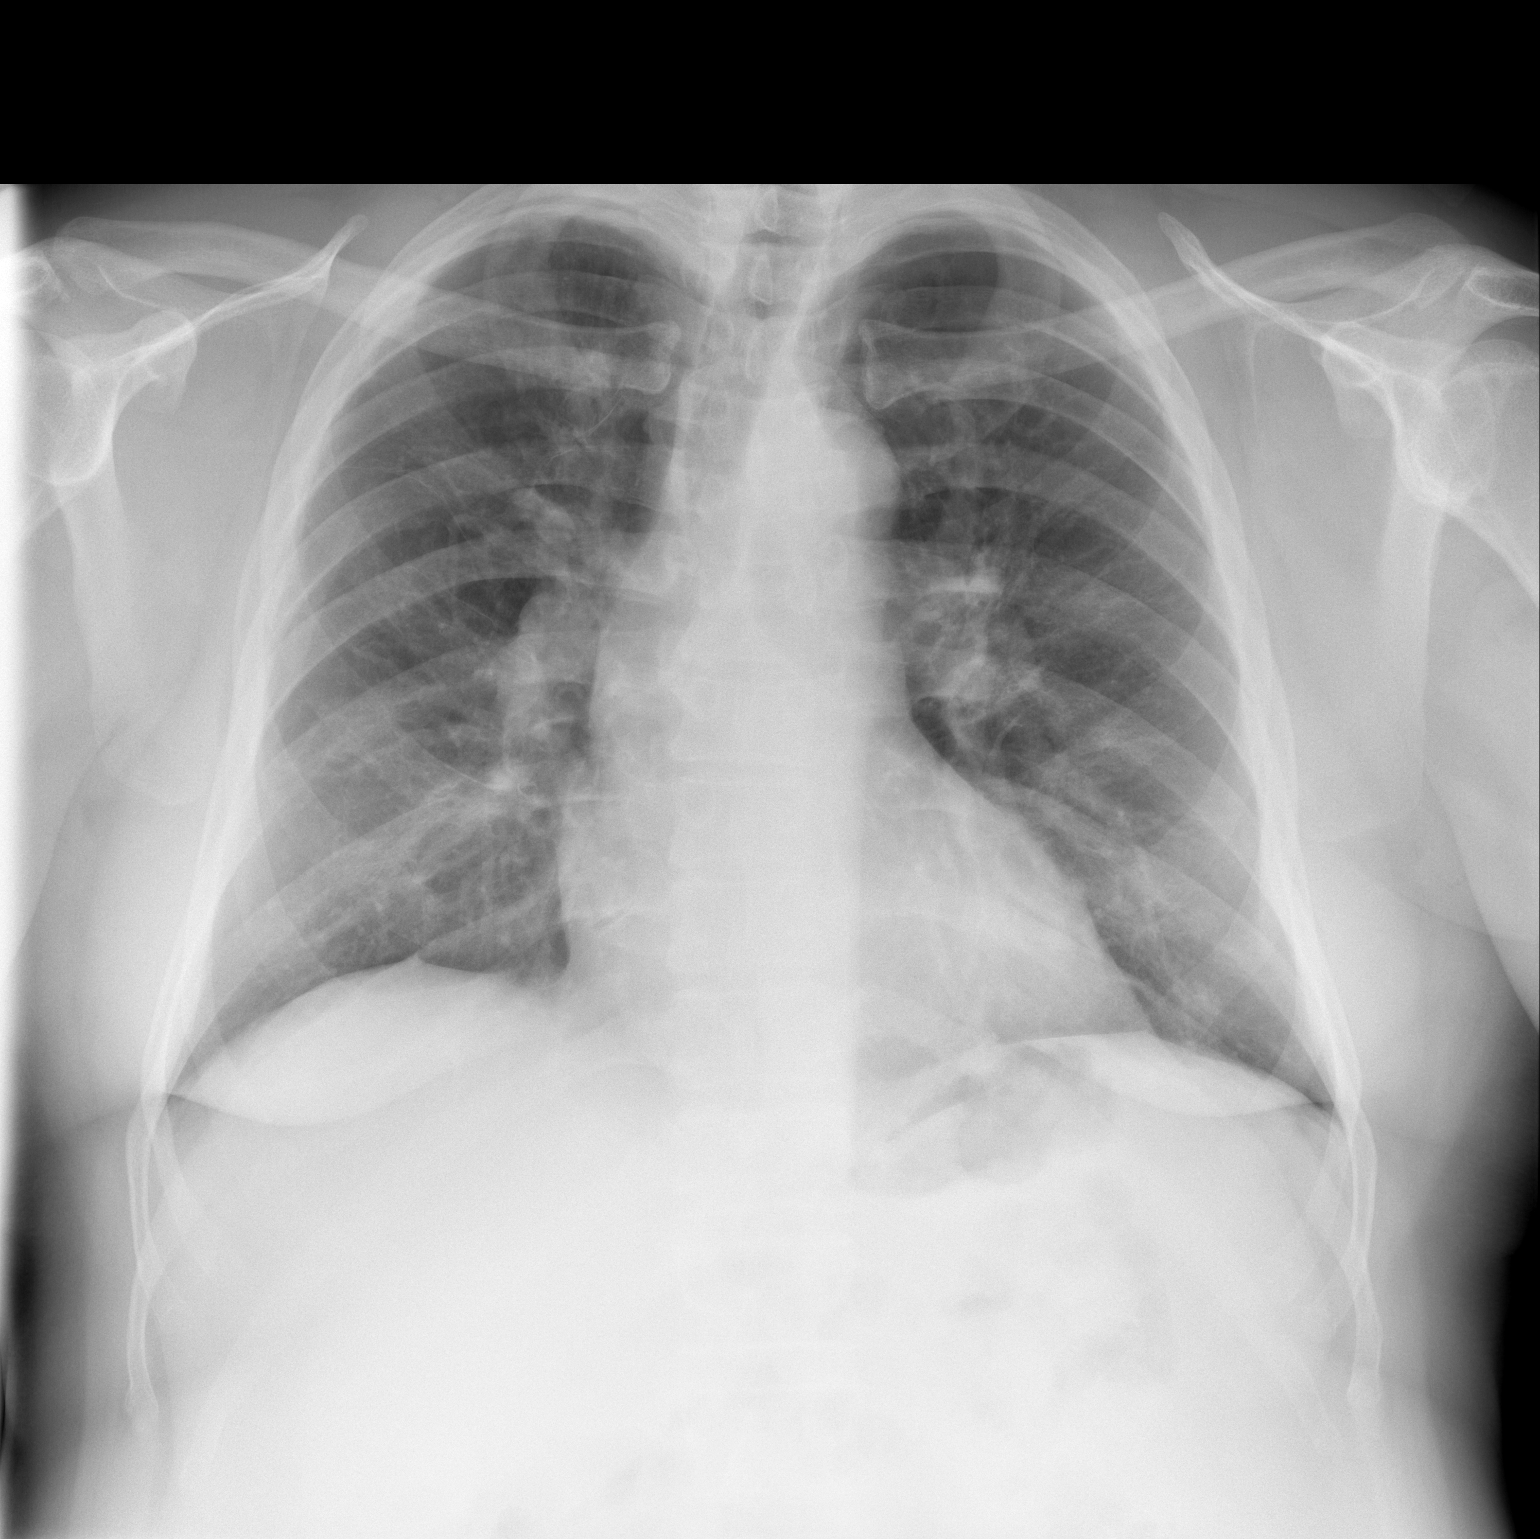

[w chest lat]
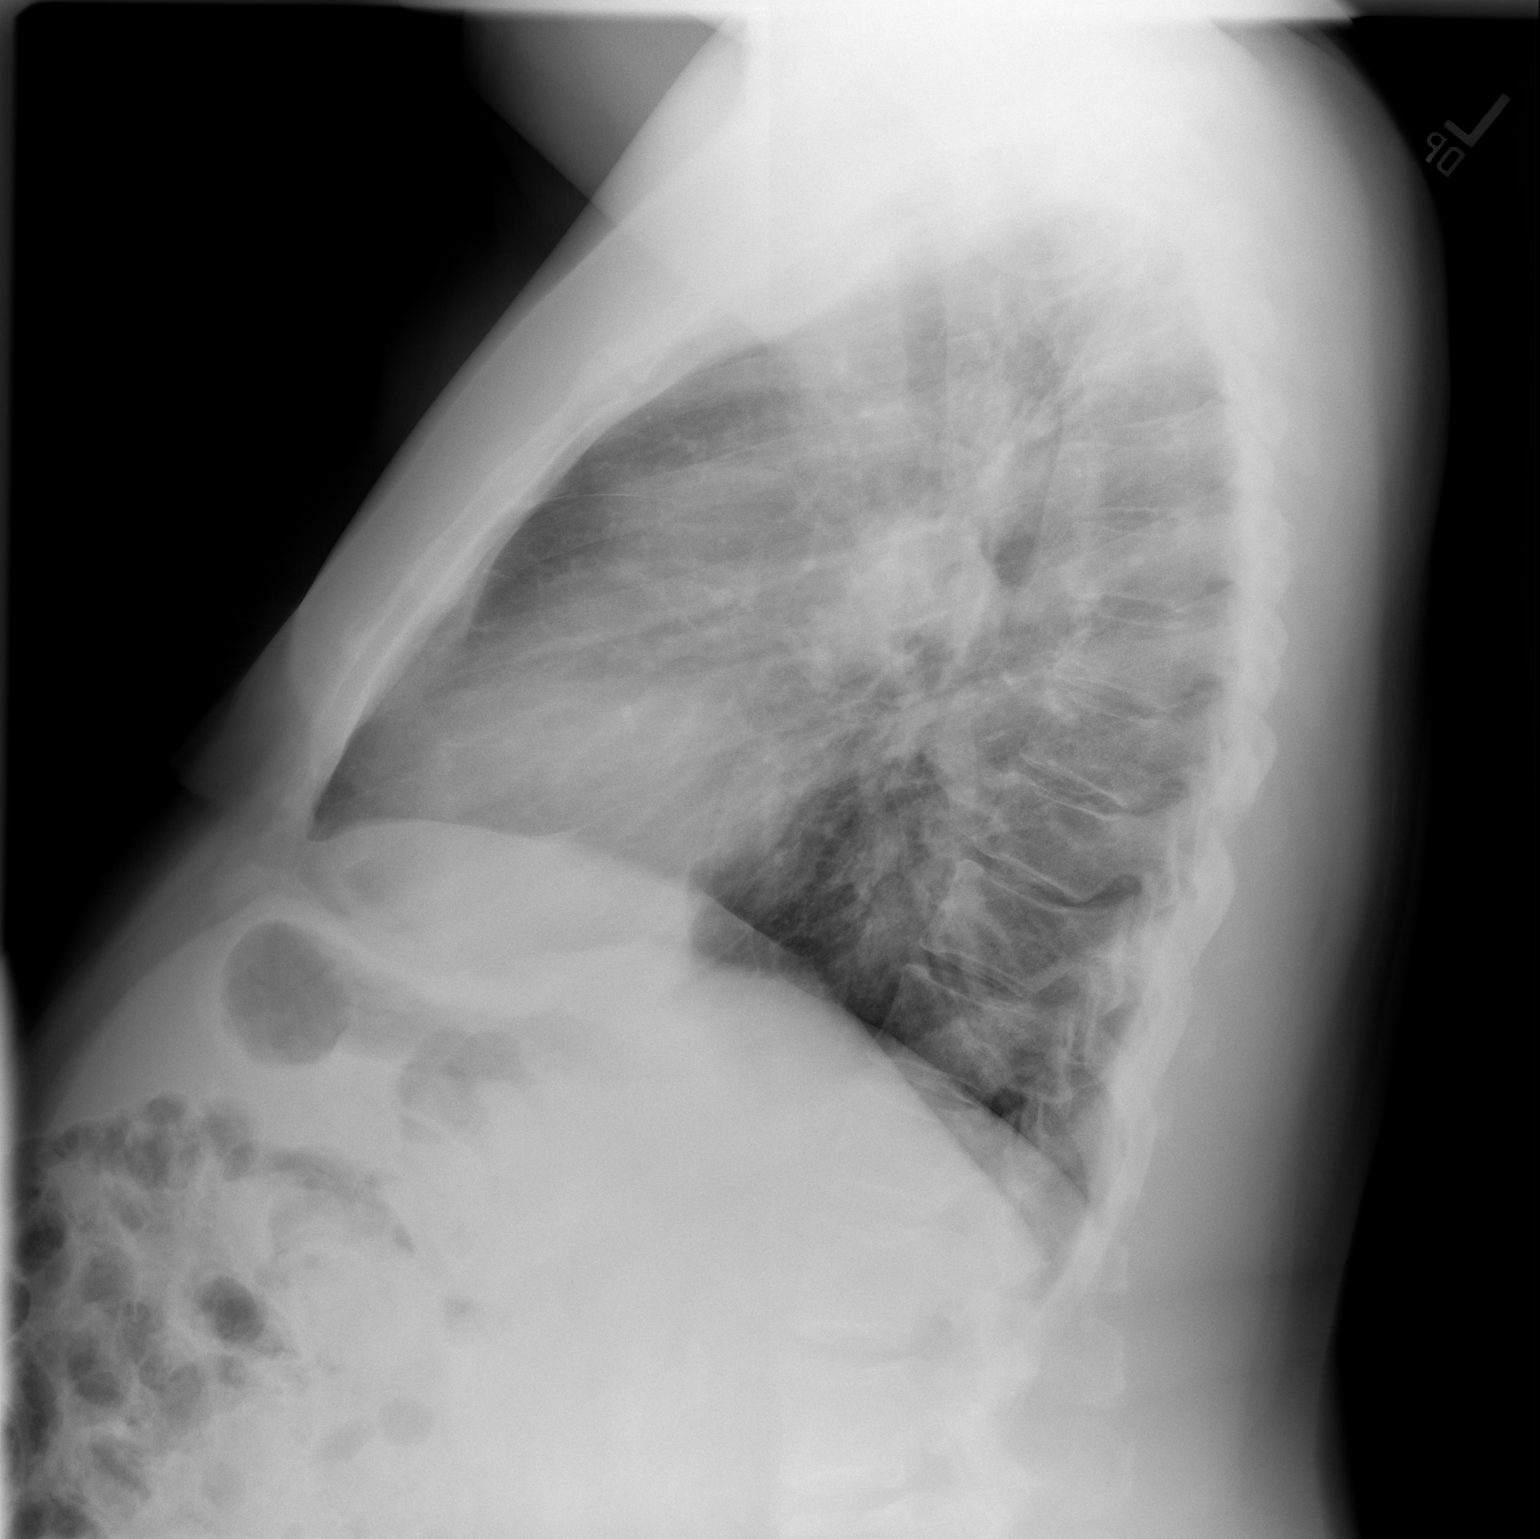

[2 of 2 positions shown; findings below may reference images not displayed]

FINDINGS: The lungs are adequately inflated. The interstitial markings are
mildly increased but stable. There is mild stable hilar prominence
bilaterally. The heart and pulmonary vascularity are normal. The
mediastinum is normal in width. There is no pleural effusion. The
bony thorax exhibits no acute abnormality.
IMPRESSION: Stable appearance of the chest since the previous study. Mild
bilateral hilar interstitial prominence, stable.

## 2016-11-03 ENCOUNTER — Ambulatory Visit: Payer: Medicare Other | Admitting: Podiatry

## 2017-02-13 ENCOUNTER — Ambulatory Visit
Admission: RE | Admit: 2017-02-13 | Discharge: 2017-02-13 | Disposition: A | Discharge status: Home / Self Care | Payer: Medicare Other | Source: Ambulatory Visit | Attending: Internal Medicine | Admitting: Internal Medicine

## 2017-02-13 ENCOUNTER — Other Ambulatory Visit: Payer: Self-pay | Admitting: Internal Medicine

## 2017-02-13 DIAGNOSIS — Z09 Encounter for follow-up examination after completed treatment for conditions other than malignant neoplasm: Secondary | ICD-10-CM

## 2018-03-04 ENCOUNTER — Ambulatory Visit
Admission: RE | Admit: 2018-03-04 | Discharge: 2018-03-04 | Disposition: A | Discharge status: Home / Self Care | Payer: Medicare Other | Source: Ambulatory Visit | Attending: Internal Medicine | Admitting: Internal Medicine

## 2018-03-04 ENCOUNTER — Other Ambulatory Visit: Payer: Self-pay | Admitting: Internal Medicine

## 2018-03-04 DIAGNOSIS — D869 Sarcoidosis, unspecified: Secondary | ICD-10-CM

## 2018-03-04 DIAGNOSIS — J61 Pneumoconiosis due to asbestos and other mineral fibers: Secondary | ICD-10-CM

## 2019-05-26 ENCOUNTER — Encounter: Payer: Self-pay | Admitting: Radiation Oncology

## 2019-05-26 NOTE — Progress Notes (Addendum)
GU Location of Tumor / Histology: prostatic adenocarcinoma  If Prostate Cancer, Gleason Score is (3 + 4) and PSA is (5.59). Prostate volume: 41.16 mL.   Danny Duran reports having his PSA check yearly but that for the last 2-3 years an increase has been noted. Patient referred to Dr. Diona Fanti who recommended a prostate biopsy.    Biopsies of prostate (if applicable) revealed:   Past/Anticipated interventions by urology, if any: prostate biopsy, referral to Dr. Tammi Klippel for consideration of brachytherapy  Past/Anticipated interventions by medical oncology, if any: no  Weight changes, if any: no  Bowel/Bladder complaints, if any: IPSS 1 with nocturia. SHIM 2. Denies sexual activity in the last six months. Denies dysuria, hematuria, urinary leakage or incontinence. Denies any bowel complaints.   Nausea/Vomiting, if any: denies  Pain issues, if any:  Reports intermittent right knee pain managed with massage and Tylenol.  SAFETY ISSUES:  Prior radiation? no  Pacemaker/ICD? no  Possible current pregnancy? no, male patient  Is the patient on methotrexate? no  Current Complaints / other details:  74 year old male. Married with 1 daughter and 1 son. Patient confirms he is fully vaccinated against Covid 19

## 2019-05-27 ENCOUNTER — Other Ambulatory Visit: Payer: Self-pay

## 2019-05-27 ENCOUNTER — Ambulatory Visit
Admission: RE | Admit: 2019-05-27 | Discharge: 2019-05-27 | Disposition: A | Discharge status: Home / Self Care | Payer: Medicare Other | Source: Ambulatory Visit | Attending: Radiation Oncology | Admitting: Radiation Oncology

## 2019-05-27 ENCOUNTER — Encounter: Payer: Self-pay | Admitting: Radiation Oncology

## 2019-05-27 VITALS — Ht 72.0 in | Wt 220.0 lb

## 2019-05-27 DIAGNOSIS — C61 Malignant neoplasm of prostate: Secondary | ICD-10-CM

## 2019-05-27 HISTORY — DX: Malignant neoplasm of prostate: C61

## 2019-05-27 NOTE — Progress Notes (Signed)
Radiation Oncology         [phone] ________________________________  Initial Outpatient Consultation - Conducted via Telephone due to current COVID-19 concerns for limiting patient exposure  Name: Danny Duran MRN: [id number]  Date: 05/27/2019  DOB: 07-Aug-1945  [id number], Danny Comber, DO  Danny Gallo, MD   REFERRING PHYSICIAN: Franchot Gallo, MD  DIAGNOSIS: 74 y.o. gentleman with Stage T1c adenocarcinoma of the prostate with Gleason score of 3+4, and PSA of 5.59.    ICD-10-CM   1. Malignant neoplasm of prostate ([place])  C61     HISTORY OF PRESENT ILLNESS: Danny Duran is a 74 y.o. male with a diagnosis of prostate cancer. He was noted to have an elevated PSA of 5.5 by his primary care physician, Dr. Nyoka Duran.  Accordingly, he was referred for evaluation in urology by Dr. Diona Duran on 10/30/2018,  digital rectal examination was performed at that time revealing no nodules. Repeat PSA in 02/2019 was stable but remained elevated at 5.59. The patient proceeded to transrectal ultrasound with 12 biopsies of the prostate on 04/07/2019.  The prostate volume measured 41.16 cc.  Out of 12 core biopsies, 5 were positive.  The maximum Gleason score was 3+4, and this was seen in the left mid lateral (with PNI), left apex lateral, and right base lateral. Additionally, Gleason 3+3 was seen in the left apex and left base lateral (small focus). A repeat PSA was performed on 04/18/19, only 10 days after his biopsy and was noted further elevated at 6.998, but this could very likely be associated with inflammation caused by recent biopsy.  The patient reviewed the biopsy results with his urologist and he has kindly been referred today for discussion of potential radiation treatment options.   PREVIOUS RADIATION THERAPY: No  PAST MEDICAL HISTORY:  Past Medical History:  Diagnosis Date  . Hyperlipidemia   . Hypertension   . Prostate cancer ([place])       PAST SURGICAL HISTORY: Past Surgical History:    Procedure Laterality Date  . INGROWN TOENAIL Left    HALLUX  . PROSTATE BIOPSY      FAMILY HISTORY:  Family History  Problem Relation Age of Onset  . Heart disease Mother   . Heart disease Father   . Breast cancer Neg Hx   . Prostate cancer Neg Hx   . Colon cancer Neg Hx   . Pancreatic cancer Neg Hx     SOCIAL HISTORY:  Social History   Socioeconomic History  . Marital status: Married    Spouse name: Not on file  . Number of children: 2  . Years of education: Not on file  . Highest education level: Not on file  Occupational History  . Not on file  Tobacco Use  . Smoking status: Never Smoker  . Smokeless tobacco: Never Used  Substance and Sexual Activity  . Alcohol use: Yes  . Drug use: No  . Sexual activity: Yes  Other Topics Concern  . Not on file  Social History Narrative  . Not on file   Social Determinants of Health   Financial Resource Strain:   . Difficulty of Paying Living Expenses:   Food Insecurity:   . Worried About Charity fundraiser in the Last Year:   . Arboriculturist in the Last Year:   Transportation Needs:   . Film/video editor (Medical):   Marland Kitchen Lack of Transportation (Non-Medical):   Physical Activity:   . Days of Exercise per Week:   .  Minutes of Exercise per Session:   Stress:   . Feeling of Stress :   Social Connections:   . Frequency of Communication with Friends and Family:   . Frequency of Social Gatherings with Friends and Family:   . Attends Religious Services:   . Active Member of Clubs or Organizations:   . Attends Archivist Meetings:   Marland Kitchen Marital Status:   Intimate Partner Violence:   . Fear of Current or Ex-Partner:   . Emotionally Abused:   Marland Kitchen Physically Abused:   . Sexually Abused:     ALLERGIES: Patient has no known allergies.  MEDICATIONS:  Current Outpatient Medications  Medication Sig Dispense Refill  . amlodipine-atorvastatin (CADUET) 10-20 MG tablet     . aspirin EC 81 MG tablet Take 81 mg  by mouth daily.    . carvedilol (COREG) 25 MG tablet Take 25 mg by mouth 2 (two) times daily with a meal.     . doxazosin (CARDURA) 8 MG tablet Take 8 mg by mouth daily.     Marland Kitchen losartan-hydrochlorothiazide (HYZAAR) 100-25 MG tablet Take 1 tablet by mouth daily.    Marland Kitchen spironolactone (ALDACTONE) 25 MG tablet     . valsartan-hydrochlorothiazide (DIOVAN-HCT) 320-25 MG tablet     . colchicine 0.6 MG tablet      No current facility-administered medications for this encounter.    REVIEW OF SYSTEMS:  On review of systems, the patient reports that he is doing well overall. He denies any chest pain, shortness of breath, cough, fevers, chills, night sweats, unintended weight changes. He denies any bowel disturbances, and denies abdominal pain, nausea or vomiting. He denies any new musculoskeletal or joint aches or pains. His IPSS was 1, indicating minimal urinary symptoms. His only complaint is nocturia. His SHIM was 2, indicating he has severe erectile dysfunction. However, he denies any sexual activity in the last 6 months. A complete review of systems is obtained and is otherwise negative.    PHYSICAL EXAM:  Wt Readings from Last 3 Encounters:  05/27/19 220 lb (99.8 kg)  11/09/15 240 lb (108.9 kg)  12/24/12 228 lb (103.4 kg)   Temp Readings from Last 3 Encounters:  11/09/15 98.6 F (37 C) (Oral)  01/15/13 98 F (36.7 C) (Oral)   BP Readings from Last 3 Encounters:  11/09/15 138/90  01/15/13 123/74  12/24/12 (!) 139/93   Pulse Readings from Last 3 Encounters:  11/09/15 81  01/15/13 77  12/24/12 66   Pain Assessment Pain Score: 0-No pain/10  Physical exam not performed in light of telephone consult visit format.   KPS = 90  100 - Normal; no complaints; no evidence of disease. 90   - Able to carry on normal activity; minor signs or symptoms of disease. 80   - Normal activity with effort; some signs or symptoms of disease. 45   - Cares for self; unable to carry on normal activity or  to do active work. 60   - Requires occasional assistance, but is able to care for most of his personal needs. 50   - Requires considerable assistance and frequent medical care. 86   - Disabled; requires special care and assistance. 73   - Severely disabled; hospital admission is indicated although death not imminent. 3   - Very sick; hospital admission necessary; active supportive treatment necessary. 10   - Moribund; fatal processes progressing rapidly. 0     - Dead  Karnofsky DA, Abelmann [place], Craver LS and Burchenal [facility] (  1948) The use of the nitrogen mustards in the palliative treatment of carcinoma: with particular reference to bronchogenic carcinoma Cancer 1 634-56  LABORATORY DATA:  Lab Results  Component Value Date   WBC 9.7 09/23/2006   HGB 13.4 09/23/2006   HGB 14.3 09/23/2006   HCT 39.7 09/23/2006   HCT 42.0 09/23/2006   MCV 89.2 09/23/2006   PLT 245 09/23/2006   Lab Results  Component Value Date   NA 141 09/23/2006   K 3.5 09/23/2006   CL 107 09/23/2006   No results found for: ALT, AST, GGT, ALKPHOS, BILITOT   RADIOGRAPHY: No results found.    IMPRESSION/PLAN: This visit was conducted via Telephone to spare the patient unnecessary potential exposure in the healthcare setting during the current COVID-19 pandemic. 1. 74 y.o. gentleman with Stage T1c adenocarcinoma of the prostate with Gleason Score of 3+4, and PSA of 5.59. We discussed the patient's workup and outlined the nature of prostate cancer in this setting. The patient's T stage, Gleason's score, and PSA put him into the favorable intermediate risk group. Accordingly, he is eligible for a variety of potential treatment options including brachytherapy, 5.5 weeks of external radiation, or prostatectomy. We discussed the available radiation techniques, and focused on the details and logistics of delivery. We discussed and outlined the risks, benefits, short and long-term effects associated with radiotherapy and compared  and contrasted these with prostatectomy. We discussed the role of SpaceOAR in reducing the rectal toxicity associated with radiotherapy.  He was encouraged to ask questions were answered to his stated satisfaction.  At the end of the conversation, the patient is interested in moving forward with brachytherapy and use of SpaceOAR to reduce rectal toxicity from radiotherapy.  We will share our discussion with Dr. Diona Duran and move forward with coordinating this procedure for the near future. The patient will be contacted by Romie Jumper in our office who will be working closely with him to coordinate OR scheduling and pre and post procedure appointments.  We will contact the pharmaceutical rep to ensure that [facility] is available at the time of procedure.  He will have a prostate MRI following his post-seed CT SIM to confirm appropriate distribution of the [place].   Given current concerns for patient exposure during the COVID-19 pandemic, this encounter was conducted via telephone. The patient was notified in advance and was offered a MyChart meeting to allow for face to face communication but unfortunately reported that he did not have the appropriate resources/technology to support such a visit and instead preferred to proceed with telephone consult. The patient has given verbal consent for this type of encounter. The time spent during this encounter was 45 minutes. The attendants for this meeting include Tyler Pita MD, Ashlyn Bruning PA-C, [place], and patient, Danny Duran. During the encounter, Tyler Pita MD, Ashlyn Bruning PA-C, and scribe, Wilburn Mylar were located at [place].  Patient, Danny Duran was located at home.    Nicholos Johns, PA-C    Tyler Pita, MD  [facility] Direct Dial: [phone]  Fax: [phone] [email or web address]  Skype  LinkedIn  This document  serves as a record of services personally performed by Tyler Pita, MD and Freeman Caldron, PA-C. It was created on their behalf by Wilburn Mylar, a trained medical scribe. The creation of this record is based on the scribe's personal observations and the provider's statements to them. This document has been checked and approved  by the attending provider.

## 2019-05-27 NOTE — Progress Notes (Signed)
See progress note under physician encounter. 

## 2019-06-03 ENCOUNTER — Encounter: Payer: Self-pay | Admitting: Medical Oncology

## 2019-06-03 NOTE — Progress Notes (Signed)
Spoke with patient to introduce myself as the prostate nurse navigator and discuss my role. I was unable to meet him 5/25, when he consulted with Dr. Tammi Klippel. He states the consult went well and was very informative. He has chosen brachytherapy to treat his prostate cancer. He has not been scheduled as of today. I explained that Enid Derry is coordinating Dr. Diona Fanti, Dr. Tammi Klippel and the OR for the procedure and she will contact him with date. I gave him my contact information and asked him to call me with questions or concerns. He voiced understanding.

## 2019-06-10 ENCOUNTER — Other Ambulatory Visit: Payer: Self-pay | Admitting: Urology

## 2019-06-10 ENCOUNTER — Telehealth: Payer: Self-pay | Admitting: *Deleted

## 2019-06-10 NOTE — Telephone Encounter (Signed)
Called patient to inform of pre-seed appts. for 06-26-19 and his implant on 07-28-19, lvm for a return call

## 2019-06-25 ENCOUNTER — Telehealth: Payer: Self-pay | Admitting: *Deleted

## 2019-06-25 NOTE — Telephone Encounter (Signed)
CALLED PATIENT TO REMIND OF PRE-SEED APPTS. FOR 06-26-19, LVM FOR A  RETURN CALL

## 2019-06-26 ENCOUNTER — Other Ambulatory Visit: Payer: Self-pay

## 2019-06-26 ENCOUNTER — Ambulatory Visit (HOSPITAL_COMMUNITY)
Admission: RE | Admit: 2019-06-26 | Discharge: 2019-06-26 | Disposition: A | Discharge status: Home / Self Care | Payer: Medicare Other | Source: Ambulatory Visit | Attending: Urology | Admitting: Urology

## 2019-06-26 ENCOUNTER — Ambulatory Visit
Admission: RE | Admit: 2019-06-26 | Discharge: 2019-06-26 | Disposition: A | Discharge status: Home / Self Care | Payer: Medicare Other | Source: Ambulatory Visit | Attending: Radiation Oncology | Admitting: Radiation Oncology

## 2019-06-26 ENCOUNTER — Encounter (HOSPITAL_COMMUNITY)
Admission: RE | Admit: 2019-06-26 | Discharge: 2019-06-26 | Disposition: A | Discharge status: Home / Self Care | Payer: Medicare Other | Source: Ambulatory Visit | Attending: Urology | Admitting: Urology

## 2019-06-26 ENCOUNTER — Encounter: Payer: Self-pay | Admitting: Medical Oncology

## 2019-06-26 ENCOUNTER — Ambulatory Visit
Admission: RE | Admit: 2019-06-26 | Discharge: 2019-06-26 | Disposition: A | Discharge status: Home / Self Care | Payer: Medicare Other | Source: Ambulatory Visit | Attending: Urology | Admitting: Urology

## 2019-06-26 ENCOUNTER — Other Ambulatory Visit: Payer: Self-pay | Admitting: Urology

## 2019-06-26 DIAGNOSIS — C61 Malignant neoplasm of prostate: Secondary | ICD-10-CM | POA: Insufficient documentation

## 2019-06-26 DIAGNOSIS — Z01818 Encounter for other preprocedural examination: Secondary | ICD-10-CM

## 2019-06-29 NOTE — Progress Notes (Signed)
  Radiation Oncology         [phone] ________________________________  Name: PARTICK MUSSELMAN MRN: [id number]  Date: 06/26/2019  DOB: [birth year]  SIMULATION AND TREATMENT PLANNING NOTE PUBIC ARCH STUDY  EP:PIRJJO, Lowella Petties, MD  DIAGNOSIS:  Oncology History  Malignant neoplasm of prostate ([place])  04/07/2019 Cancer Staging   Staging form: Prostate, AJCC 8th Edition - Clinical stage from 04/07/2019: Stage IIB (cT1c, cN0, cM0, PSA: 5.6, Grade Group: 2) - Signed by Freeman Caldron, PA-C on 05/27/2019   05/27/2019 Initial Diagnosis   Malignant neoplasm of prostate ([place])       ICD-10-CM   1. Malignant neoplasm of prostate ([place])  C61     COMPLEX SIMULATION:  The patient presented today for evaluation for possible prostate seed implant. He was brought to the radiation planning suite and placed supine on the CT couch. A 3-dimensional image study set was obtained in upload to the planning computer. There, on each axial slice, I contoured the prostate gland. Then, using three-dimensional radiation planning tools I reconstructed the prostate in view of the structures from the transperineal needle pathway to assess for possible pubic arch interference. In doing so, I did not appreciate any pubic arch interference. Also, the patient's prostate volume was estimated based on the drawn structure. The volume was 43 cc.  Given the pubic arch appearance and prostate volume, patient remains a good candidate to proceed with prostate seed implant. Today, he freely provided informed written consent to proceed.    PLAN: The patient will undergo prostate seed implant.   ________________________________  Sheral Apley. Tammi Klippel, M.D.

## 2019-07-16 ENCOUNTER — Telehealth: Payer: Self-pay | Admitting: *Deleted

## 2019-07-16 NOTE — Telephone Encounter (Signed)
Returned patient's phone call, spoke with patient 

## 2019-07-18 ENCOUNTER — Other Ambulatory Visit: Payer: Self-pay

## 2019-07-18 ENCOUNTER — Telehealth: Payer: Self-pay | Admitting: *Deleted

## 2019-07-18 ENCOUNTER — Encounter (HOSPITAL_BASED_OUTPATIENT_CLINIC_OR_DEPARTMENT_OTHER): Payer: Self-pay | Admitting: Urology

## 2019-07-18 NOTE — Progress Notes (Signed)
Spoke w/ via phone for pre-op interview--- PT Lab needs dos----  no             Lab results------ getting CBC, CMP, PT/PTT done 07-24-2019 @ 0915;  Current cxr/ ekg done 06-26-2019 results in epic/ chart COVID test ------ 07-24-2019 @ 0815 Arrive at ------- 0900 NPO after MN NO Solid Food.  Clear liquids from MN until--- 0800 then nothing by mouth Medications to take morning of surgery ----- Darryll Capers, Coreg w/ sips of water Diabetic medication ----- n/a Patient Special Instructions ----- to do one fleet enema morning of surgery Pre-Op special Istructions ----- n/a Patient verbalized understanding of instructions that were given at this phone interview. Patient denies shortness of breath, chest pain, fever, cough a this phone interview.

## 2019-07-18 NOTE — Telephone Encounter (Signed)
CALLED PATIENT TO REMIND OF LAB AND COVID TESTING FOR 07-24-19, SPOKE WITH PATIENT AND HE IS AWARE OF THESE APPTS.

## 2019-07-24 ENCOUNTER — Telehealth: Payer: Self-pay | Admitting: *Deleted

## 2019-07-24 ENCOUNTER — Other Ambulatory Visit: Payer: Self-pay

## 2019-07-24 ENCOUNTER — Encounter (HOSPITAL_COMMUNITY)
Admission: RE | Admit: 2019-07-24 | Discharge: 2019-07-24 | Disposition: A | Discharge status: Home / Self Care | Payer: Medicare Other | Source: Ambulatory Visit | Attending: Urology | Admitting: Urology

## 2019-07-24 ENCOUNTER — Other Ambulatory Visit (HOSPITAL_COMMUNITY)
Admission: RE | Admit: 2019-07-24 | Discharge: 2019-07-24 | Disposition: A | Discharge status: Home / Self Care | Payer: Medicare Other | Source: Ambulatory Visit | Attending: Urology | Admitting: Urology

## 2019-07-24 DIAGNOSIS — Z01812 Encounter for preprocedural laboratory examination: Secondary | ICD-10-CM | POA: Insufficient documentation

## 2019-07-24 DIAGNOSIS — Z20822 Contact with and (suspected) exposure to covid-19: Secondary | ICD-10-CM | POA: Diagnosis not present

## 2019-07-24 LAB — APTT: aPTT: 25 seconds (ref 24–36)

## 2019-07-24 LAB — COMPREHENSIVE METABOLIC PANEL
ALT: 29 U/L (ref 0–44)
AST: 21 U/L (ref 15–41)
Albumin: 4.1 g/dL (ref 3.5–5.0)
Alkaline Phosphatase: 53 U/L (ref 38–126)
Anion gap: 7 (ref 5–15)
BUN: 31 mg/dL — ABNORMAL HIGH (ref 8–23)
CO2: 28 mmol/L (ref 22–32)
Calcium: 10.4 mg/dL — ABNORMAL HIGH (ref 8.9–10.3)
Chloride: 106 mmol/L (ref 98–111)
Creatinine, Ser: 1.78 mg/dL — ABNORMAL HIGH (ref 0.61–1.24)
GFR calc Af Amer: 43 mL/min — ABNORMAL LOW (ref >60)
GFR calc non Af Amer: 37 mL/min — ABNORMAL LOW (ref >60)
Glucose, Bld: 136 mg/dL — ABNORMAL HIGH (ref 70–99)
Potassium: 4.3 mmol/L (ref 3.5–5.1)
Sodium: 141 mmol/L (ref 135–145)
Total Bilirubin: 0.6 mg/dL (ref 0.3–1.2)
Total Protein: 7.1 g/dL (ref 6.5–8.1)

## 2019-07-24 LAB — CBC
HCT: 44.9 % (ref 39.0–52.0)
Hemoglobin: 14.5 g/dL (ref 13.0–17.0)
MCH: 30.8 pg (ref 26.0–34.0)
MCHC: 32.3 g/dL (ref 30.0–36.0)
MCV: 95.3 fL (ref 80.0–100.0)
Platelets: 185 10*3/uL (ref 150–400)
RBC: 4.71 MIL/uL (ref 4.22–5.81)
RDW: 13.8 % (ref 11.5–15.5)
WBC: 6 10*3/uL (ref 4.0–10.5)
nRBC: 0 % (ref 0.0–0.2)

## 2019-07-24 LAB — PROTIME-INR
INR: 1 (ref 0.8–1.2)
Prothrombin Time: 12.8 seconds (ref 11.4–15.2)

## 2019-07-24 LAB — SARS CORONAVIRUS 2 (TAT 6-24 HRS): SARS Coronavirus 2: NEGATIVE

## 2019-07-24 NOTE — Telephone Encounter (Signed)
Called patient to remind of procedure for 07-28-19, spoke with patient and he is aware of this procedure

## 2019-07-28 ENCOUNTER — Ambulatory Visit (HOSPITAL_BASED_OUTPATIENT_CLINIC_OR_DEPARTMENT_OTHER): Payer: Medicare Other | Admitting: Anesthesiology

## 2019-07-28 ENCOUNTER — Ambulatory Visit (HOSPITAL_BASED_OUTPATIENT_CLINIC_OR_DEPARTMENT_OTHER)
Admission: RE | Admit: 2019-07-28 | Discharge: 2019-07-28 | Disposition: A | Discharge status: Home / Self Care | Payer: Medicare Other | Attending: Urology | Admitting: Urology

## 2019-07-28 ENCOUNTER — Ambulatory Visit (HOSPITAL_COMMUNITY): Payer: Medicare Other

## 2019-07-28 ENCOUNTER — Encounter (HOSPITAL_BASED_OUTPATIENT_CLINIC_OR_DEPARTMENT_OTHER): Payer: Self-pay | Admitting: Urology

## 2019-07-28 ENCOUNTER — Encounter (HOSPITAL_BASED_OUTPATIENT_CLINIC_OR_DEPARTMENT_OTHER)
Admission: RE | Disposition: A | Discharge status: Home / Self Care | Payer: Self-pay | Source: Home / Self Care | Attending: Urology

## 2019-07-28 DIAGNOSIS — M1A9XX0 Chronic gout, unspecified, without tophus (tophi): Secondary | ICD-10-CM | POA: Diagnosis not present

## 2019-07-28 DIAGNOSIS — Z79899 Other long term (current) drug therapy: Secondary | ICD-10-CM | POA: Insufficient documentation

## 2019-07-28 DIAGNOSIS — E785 Hyperlipidemia, unspecified: Secondary | ICD-10-CM | POA: Insufficient documentation

## 2019-07-28 DIAGNOSIS — C61 Malignant neoplasm of prostate: Secondary | ICD-10-CM | POA: Diagnosis present

## 2019-07-28 DIAGNOSIS — I4510 Unspecified right bundle-branch block: Secondary | ICD-10-CM | POA: Insufficient documentation

## 2019-07-28 DIAGNOSIS — I10 Essential (primary) hypertension: Secondary | ICD-10-CM | POA: Insufficient documentation

## 2019-07-28 DIAGNOSIS — Z7982 Long term (current) use of aspirin: Secondary | ICD-10-CM | POA: Diagnosis not present

## 2019-07-28 HISTORY — PX: CYSTOSCOPY: SHX5120

## 2019-07-28 HISTORY — DX: Unspecified right bundle-branch block: I45.10

## 2019-07-28 HISTORY — DX: Presence of spectacles and contact lenses: Z97.3

## 2019-07-28 HISTORY — DX: Chronic gout, unspecified, without tophus (tophi): M1A.9XX0

## 2019-07-28 HISTORY — PX: RADIOACTIVE SEED IMPLANT: SHX5150

## 2019-07-28 HISTORY — PX: SPACE OAR INSTILLATION: SHX6769

## 2019-07-28 HISTORY — DX: Nocturia: R35.1

## 2019-07-28 SURGERY — INSERTION, RADIATION SOURCE, PROSTATE
Anesthesia: General | Site: Rectum

## 2019-07-28 MED ORDER — PHENYLEPHRINE 40 MCG/ML (10ML) SYRINGE FOR IV PUSH (FOR BLOOD PRESSURE SUPPORT)
PREFILLED_SYRINGE | INTRAVENOUS | Status: AC
  Filled 2019-07-28: qty 10

## 2019-07-28 MED ORDER — IOHEXOL 300 MG/ML  SOLN
INTRAMUSCULAR | Status: DC | PRN
  Administered 2019-07-28: 7 mL via URETHRAL

## 2019-07-28 MED ORDER — PHENYLEPHRINE 40 MCG/ML (10ML) SYRINGE FOR IV PUSH (FOR BLOOD PRESSURE SUPPORT)
PREFILLED_SYRINGE | INTRAVENOUS | Status: DC | PRN
  Administered 2019-07-28: 120 ug via INTRAVENOUS

## 2019-07-28 MED ORDER — EPHEDRINE SULFATE-NACL 50-0.9 MG/10ML-% IV SOSY
PREFILLED_SYRINGE | INTRAVENOUS | Status: DC | PRN
  Administered 2019-07-28 (×3): 10 mg via INTRAVENOUS
  Administered 2019-07-28: 5 mg via INTRAVENOUS

## 2019-07-28 MED ORDER — LIDOCAINE 2% (20 MG/ML) 5 ML SYRINGE
INTRAMUSCULAR | Status: AC
  Filled 2019-07-28: qty 5

## 2019-07-28 MED ORDER — CEFAZOLIN SODIUM-DEXTROSE 2-4 GM/100ML-% IV SOLN
INTRAVENOUS | Status: AC
  Filled 2019-07-28: qty 100

## 2019-07-28 MED ORDER — LIDOCAINE HCL (CARDIAC) PF 100 MG/5ML IV SOSY
PREFILLED_SYRINGE | INTRAVENOUS | Status: DC | PRN
  Administered 2019-07-28: 80 mg via INTRAVENOUS

## 2019-07-28 MED ORDER — DEXAMETHASONE SODIUM PHOSPHATE 10 MG/ML IJ SOLN
INTRAMUSCULAR | Status: DC | PRN
Start: 2019-07-28 — End: 2019-07-28
  Administered 2019-07-28: 8 mg via INTRAVENOUS

## 2019-07-28 MED ORDER — ONDANSETRON HCL 4 MG/2ML IJ SOLN
INTRAMUSCULAR | Status: DC | PRN
  Administered 2019-07-28: 4 mg via INTRAVENOUS

## 2019-07-28 MED ORDER — FENTANYL CITRATE (PF) 100 MCG/2ML IJ SOLN
INTRAMUSCULAR | Status: AC
  Filled 2019-07-28: qty 2

## 2019-07-28 MED ORDER — PROPOFOL 10 MG/ML IV BOLUS
INTRAVENOUS | Status: DC | PRN
  Administered 2019-07-28: 200 mg via INTRAVENOUS

## 2019-07-28 MED ORDER — PROPOFOL 10 MG/ML IV BOLUS
INTRAVENOUS | Status: AC
  Filled 2019-07-28: qty 20

## 2019-07-28 MED ORDER — CEFAZOLIN SODIUM-DEXTROSE 2-4 GM/100ML-% IV SOLN
2.0000 g | Freq: Once | INTRAVENOUS | Status: AC
  Administered 2019-07-28: 2 g via INTRAVENOUS

## 2019-07-28 MED ORDER — LACTATED RINGERS IV SOLN: INTRAVENOUS | Status: DC

## 2019-07-28 MED ORDER — FENTANYL CITRATE (PF) 100 MCG/2ML IJ SOLN
INTRAMUSCULAR | Status: DC | PRN
  Administered 2019-07-28 (×2): 50 ug via INTRAVENOUS

## 2019-07-28 MED ORDER — AMISULPRIDE (ANTIEMETIC) 5 MG/2ML IV SOLN: 10.0000 mg | Freq: Once | INTRAVENOUS | Status: DC | PRN

## 2019-07-28 MED ORDER — EPHEDRINE 5 MG/ML INJ
INTRAVENOUS | Status: AC
  Filled 2019-07-28: qty 10

## 2019-07-28 MED ORDER — STERILE WATER FOR IRRIGATION IR SOLN
Status: DC | PRN
  Administered 2019-07-28: 3 mL

## 2019-07-28 MED ORDER — ONDANSETRON HCL 4 MG/2ML IJ SOLN
INTRAMUSCULAR | Status: AC
  Filled 2019-07-28: qty 2

## 2019-07-28 MED ORDER — SODIUM CHLORIDE (PF) 0.9 % IJ SOLN
INTRAMUSCULAR | Status: DC | PRN
  Administered 2019-07-28: 3 mL

## 2019-07-28 MED ORDER — FLEET ENEMA 7-19 GM/118ML RE ENEM: 1.0000 | ENEMA | Freq: Once | RECTAL | Status: DC

## 2019-07-28 MED ORDER — ACETAMINOPHEN 500 MG PO TABS
1000.0000 mg | ORAL_TABLET | Freq: Once | ORAL | Status: AC
  Administered 2019-07-28: 1000 mg via ORAL

## 2019-07-28 MED ORDER — FENTANYL CITRATE (PF) 100 MCG/2ML IJ SOLN: 25.0000 ug | INTRAMUSCULAR | Status: DC | PRN

## 2019-07-28 MED ORDER — ACETAMINOPHEN 500 MG PO TABS
ORAL_TABLET | ORAL | Status: AC
  Filled 2019-07-28: qty 2

## 2019-07-28 MED ORDER — SODIUM CHLORIDE 0.9 % IV SOLN
INTRAVENOUS | Status: AC | PRN
  Administered 2019-07-28: 200 mL

## 2019-07-28 SURGICAL SUPPLY — 36 items
BAG DRN RND TRDRP ANRFLXCHMBR (UROLOGICAL SUPPLIES) ×3
BAG URINE DRAIN 2000ML AR STRL (UROLOGICAL SUPPLIES) ×4 IMPLANT
BLADE CLIPPER SENSICLIP SURGIC (BLADE) ×4 IMPLANT
CATH FOLEY 2WAY SLVR  5CC 16FR (CATHETERS) ×4
CATH FOLEY 2WAY SLVR 5CC 16FR (CATHETERS) ×3 IMPLANT
CATH ROBINSON RED A/P 16FR (CATHETERS) IMPLANT
CATH ROBINSON RED A/P 20FR (CATHETERS) ×4 IMPLANT
CLOTH BEACON ORANGE TIMEOUT ST (SAFETY) ×4 IMPLANT
CNTNR URN SCR LID CUP LEK RST (MISCELLANEOUS) ×6 IMPLANT
CONT SPEC 4OZ STRL OR WHT (MISCELLANEOUS) ×8
COVER BACK TABLE 60X90IN (DRAPES) ×4 IMPLANT
COVER MAYO STAND STRL (DRAPES) ×4 IMPLANT
DRAPE C-ARM 35X43 STRL (DRAPES) ×4 IMPLANT
DRAPE U-SHAPE 47X51 STRL (DRAPES) IMPLANT
DRSG TEGADERM 4X4.75 (GAUZE/BANDAGES/DRESSINGS) ×4 IMPLANT
DRSG TEGADERM 8X12 (GAUZE/BANDAGES/DRESSINGS) ×12 IMPLANT
GAUZE SPONGE 4X4 12PLY STRL LF (GAUZE/BANDAGES/DRESSINGS) ×4 IMPLANT
GLOVE BIO SURGEON STRL SZ7.5 (GLOVE) ×4 IMPLANT
GLOVE BIO SURGEON STRL SZ8 (GLOVE) ×4 IMPLANT
GLOVE SURG ORTHO 8.5 STRL (GLOVE) ×4 IMPLANT
GLOVE SURG SS PI 6.5 STRL IVOR (GLOVE) ×4 IMPLANT
GOWN STRL REUS W/TWL XL LVL3 (GOWN DISPOSABLE) ×12 IMPLANT
HOLDER FOLEY CATH W/STRAP (MISCELLANEOUS) IMPLANT
I-Seed AgX100 ×276 IMPLANT
IMPL SPACEOAR VUE SYSTEM (Spacer) ×3 IMPLANT
IMPLANT SPACEOAR VUE SYSTEM (Spacer) ×4 IMPLANT
IV NS 1000ML (IV SOLUTION) ×4
IV NS 1000ML BAXH (IV SOLUTION) ×3 IMPLANT
KIT TURNOVER CYSTO (KITS) ×4 IMPLANT
MARKER SKIN DUAL TIP RULER LAB (MISCELLANEOUS) ×4 IMPLANT
PACK CYSTO (CUSTOM PROCEDURE TRAY) ×4 IMPLANT
SUT BONE WAX W31G (SUTURE) IMPLANT
SYR 10ML LL (SYRINGE) ×8 IMPLANT
TOWEL OR 17X26 10 PK STRL BLUE (TOWEL DISPOSABLE) ×4 IMPLANT
UNDERPAD 30X30 (UNDERPADS AND DIAPERS) ×4 IMPLANT
WATER STERILE IRR 500ML POUR (IV SOLUTION) ×4 IMPLANT

## 2019-07-28 NOTE — Interval H&P Note (Signed)
History and Physical Interval Note:  07/28/2019 10:09 AM  Danny Duran  has presented today for surgery, with the diagnosis of PROSTATE CANCER.  The various methods of treatment have been discussed with the patient and family. After consideration of risks, benefits and other options for treatment, the patient has consented to  Procedure(s) with comments: RADIOACTIVE SEED IMPLANT/BRACHYTHERAPY IMPLANT (N/A) - 90 MINS SPACE OAR INSTILLATION (N/A) CYSTOSCOPY FLEXIBLE (N/A) as a surgical intervention.  The patient's history has been reviewed, patient examined, no change in status, stable for surgery.  I have reviewed the patient's chart and labs.  Questions were answered to the patient's satisfaction.     Lillette Boxer Wadie Liew

## 2019-07-28 NOTE — Progress Notes (Signed)
  Radiation Oncology         [phone] ________________________________  Name: Danny Duran MRN: [id number]  Date: 07/28/2019  DOB: [birth year]       Prostate Seed Implant  OV:ZCHYIF, [name], DO  No ref. provider found  DIAGNOSIS: 74 y.o. gentleman with Stage T1c adenocarcinoma of the prostate with Gleason score of 3+4, and PSA of 5.59.   Oncology History  Malignant neoplasm of prostate ([place])  04/07/2019 Cancer Staging   Staging form: Prostate, AJCC 8th Edition - Clinical stage from 04/07/2019: Stage IIB (cT1c, cN0, cM0, PSA: 5.6, Grade Group: 2) - Signed by Freeman Caldron, PA-C on 05/27/2019   05/27/2019 Initial Diagnosis   Malignant neoplasm of prostate ([place])     PROCEDURE: Insertion of radioactive I-125 seeds into the prostate gland.  RADIATION DOSE: 145 Gy, definitive therapy.  TECHNIQUE: Danny Duran was brought to the operating room with the urologist. He was placed in the dorsolithotomy position. He was catheterized and a rectal tube was inserted. The perineum was shaved, prepped and draped. The ultrasound probe was then introduced into the rectum to see the prostate gland.  TREATMENT DEVICE: A needle grid was attached to the ultrasound probe stand and anchor needles were placed.  3D PLANNING: The prostate was imaged in 3D using a sagittal sweep of the prostate probe. These images were transferred to the planning computer. There, the prostate, urethra and rectum were defined on each axial reconstructed image. Then, the software created an optimized 3D plan and a few seed positions were adjusted. The quality of the plan was reviewed using [facility] information for the target and the following two organs at risk:  Urethra and Rectum.  Then the accepted plan was printed and handed off to the radiation therapist.  Under my supervision, the custom loading of the seeds and spacers was carried out and loaded into sealed vicryl sleeves.  These pre-loaded needles were then placed into the  needle holder.Marland Kitchen  PROSTATE VOLUME STUDY:  Using transrectal ultrasound the volume of the prostate was verified to be 52.5 cc.  SPECIAL TREATMENT PROCEDURE/SUPERVISION AND HANDLING: The pre-loaded needles were then delivered under sagittal guidance. A total of 22 needles were used to deposit 69 seeds in the prostate gland. The individual seed activity was 0.507 mCi.  SpaceOAR:  Yes, VUE  COMPLEX SIMULATION: At the end of the procedure, an anterior radiograph of the pelvis was obtained to document seed positioning and count. Cystoscopy was performed to check the urethra and bladder.  MICRODOSIMETRY: At the end of the procedure, the patient was emitting 0.098 mR/hr at 1 meter. Accordingly, he was considered safe for hospital discharge.  PLAN: The patient will return to the radiation oncology clinic for post implant CT dosimetry in three weeks.   ________________________________  Sheral Apley Tammi Klippel, M.D.

## 2019-07-28 NOTE — Anesthesia Preprocedure Evaluation (Signed)
Anesthesia Evaluation  Patient identified by MRN, date of birth, ID band Patient awake    Reviewed: Allergy & Precautions, NPO status , Patient's Chart, lab work & pertinent test results  Airway Mallampati: II  TM Distance: >3 FB Neck ROM: Full    Dental  (+) Dental Advisory Given   Pulmonary neg pulmonary ROS,    breath sounds clear to auscultation       Cardiovascular hypertension, Pt. on medications and Pt. on home beta blockers + dysrhythmias  Rhythm:Regular Rate:Normal     Neuro/Psych negative neurological ROS     GI/Hepatic negative GI ROS, Neg liver ROS,   Endo/Other  negative endocrine ROS  Renal/GU Renal disease     Musculoskeletal   Abdominal   Peds  Hematology negative hematology ROS (+)   Anesthesia Other Findings   Reproductive/Obstetrics                             Anesthesia Physical Anesthesia Plan  ASA: II  Anesthesia Plan: General   Post-op Pain Management:    Induction: Intravenous  PONV Risk Score and Plan: 2 and Dexamethasone, Ondansetron and Treatment may vary due to age or medical condition  Airway Management Planned: LMA  Additional Equipment:   Intra-op Plan:   Post-operative Plan: Extubation in OR  Informed Consent: I have reviewed the patients History and Physical, chart, labs and discussed the procedure including the risks, benefits and alternatives for the proposed anesthesia with the patient or authorized representative who has indicated his/her understanding and acceptance.     Dental advisory given  Plan Discussed with: CRNA  Anesthesia Plan Comments:         Anesthesia Quick Evaluation

## 2019-07-28 NOTE — H&P (Signed)
H&P  Chief Complaint: Prostate cancer  History of Present Illness: 74 year old male presents for I-125 brachytherapy and SpaceOAR placement.  He has low/low intermediate risk prostate cancer.  Consultation with Dr. Tammi Klippel was performed, and he is chosen to have brachytherapy as primary treatment modality for his prostate cancer.  Review of his prostate cancer is below:  4.5.2021: TRUS/Bx performed. PSA 5.59, volume 41.16 ml, PSAD 0.14. 5/12 cores revealed PCa--   2 cores --Lt base lateral, 5% involvement, Lt apex medial,50% involvement revealed GS 3+3 pattern   3 cores--Lt mid lateral, 50% involvement, Lt apex lateral ,60% involvement, Rt base lateral ,10% involvement revealed GS 3+4 pattern.    Past Medical History:  Diagnosis Date  . Chronic gout    07-18-2019  per pt last episode left great toe approx. 11/ 2020  . Hyperlipidemia   . Hyperlipidemia   . Hypertension    followed by pcp   (07-18-2019 per pt had one yrs ago, was told ok)  . Nocturia   . Prostate cancer [facility]) urologist--- dr Mazen Marcin/  oncology-- dr Tammi Klippel   dx 04/ 2021  Stage T1c, Gleason 3+4  . RBBB (right bundle branch block)   . Wears glasses     Past Surgical History:  Procedure Laterality Date  . NO PAST SURGERIES    . PROSTATE BIOPSY      Home Medications:    Allergies: No Known Allergies  Family History  Problem Relation Age of Onset  . Heart disease Mother   . Heart disease Father   . Breast cancer Neg Hx   . Prostate cancer Neg Hx   . Colon cancer Neg Hx   . Pancreatic cancer Neg Hx     Social History:  reports that he has never smoked. He has never used smokeless tobacco. He reports current alcohol use. He reports that he does not use drugs.  ROS: A complete review of systems was performed.  All systems are negative except for pertinent findings as noted.  Physical Exam:  Vital signs in last 24 hours: BP 120/79   Pulse 63   Temp (!) 97.4 F (36.3 C) (Oral)   Resp 17   Ht 6'  (1.829 m)   Wt (!) 100.2 kg   SpO2 100%   BMI 29.96 kg/m  Constitutional:  Alert and oriented, No acute distress Cardiovascular: Regular rate  Respiratory: Normal respiratory effort GI: Abdomen is soft, nontender, nondistended, no abdominal masses. No CVAT.  Genitourinary: Normal male phallus, testes are descended bilaterally and non-tender and without masses, scrotum is normal in appearance without lesions or masses, perineum is normal on inspection. Lymphatic: No lymphadenopathy Neurologic: Grossly intact, no focal deficits Psychiatric: Normal mood and affect  Laboratory Data:  No results for input(s): WBC, HGB, HCT, PLT in the last 72 hours.  No results for input(s): NA, K, CL, GLUCOSE, BUN, CALCIUM, CREATININE in the last 72 hours.  Invalid input(s): CO3   No results found for this or any previous visit (from the past 24 hour(s)). Recent Results (from the past 240 hour(s))  SARS CORONAVIRUS 2 (TAT 6-24 HRS) Nasopharyngeal Nasopharyngeal Swab     Status: None   Collection Time: 07/24/19  8:24 AM   Specimen: Nasopharyngeal Swab  Result Value Ref Range Status   SARS Coronavirus 2 NEGATIVE NEGATIVE Final    Comment: (NOTE) SARS-CoV-2 target nucleic acids are NOT DETECTED.  The SARS-CoV-2 RNA is generally detectable in upper and lower respiratory specimens during the acute phase of infection. Negative  results do not preclude SARS-CoV-2 infection, do not rule out co-infections with other pathogens, and should not be used as the sole basis for treatment or other patient management decisions. Negative results must be combined with clinical observations, patient history, and epidemiological information. The expected result is Negative.  Fact Sheet for Patients: SugarRoll.be  Fact Sheet for Healthcare Providers: https://www.woods-mathews.com/  This test is not yet approved or cleared by the [place] FDA and  has been authorized  for detection and/or diagnosis of SARS-CoV-2 by FDA under an Emergency Use Authorization (EUA). This EUA will remain  in effect (meaning this test can be used) for the duration of the COVID-19 declaration under Se ction 564(b)(1) of the Act, 21 U.S.C. section 360bbb-3(b)(1), unless the authorization is terminated or revoked sooner.  Performed at [facility], [place] [address], [place], [place] [address]     Renal Function: Recent Labs    07/24/19 9242  CREATININE 1.78*   Estimated Creatinine Clearance: 44.6 mL/min (A) (by C-G formula based on SCr of 1.78 mg/dL (H)).  Radiologic Imaging: No results found.  Impression/Assessment:  Low/low intermediate risk prostate cancer  Plan:  I-125 brachytherapy, SpaceOAR placement

## 2019-07-28 NOTE — Anesthesia Postprocedure Evaluation (Signed)
Anesthesia Post Note  Patient: Danny Duran  Procedure(s) Performed: RADIOACTIVE SEED IMPLANT/BRACHYTHERAPY IMPLANT (N/A Prostate) SPACE OAR INSTILLATION (N/A Rectum) CYSTOSCOPY FLEXIBLE (N/A Bladder)     Patient location during evaluation: PACU Anesthesia Type: General Level of consciousness: awake and alert Pain management: pain level controlled Vital Signs Assessment: post-procedure vital signs reviewed and stable Respiratory status: spontaneous breathing, nonlabored ventilation, respiratory function stable and patient connected to nasal cannula oxygen Cardiovascular status: blood pressure returned to baseline and stable Postop Assessment: no apparent nausea or vomiting Anesthetic complications: no   No complications documented.  Last Vitals:  Vitals:   07/28/19 1300 07/28/19 1315  BP: 114/75 (!) 132/83  Pulse: 51 47  Resp: 18 (!) 11  Temp:    SpO2: 93% 98%    Last Pain:  Vitals:   07/28/19 1315  TempSrc:   PainSc: 0-No pain                 Tiajuana Amass

## 2019-07-28 NOTE — Anesthesia Procedure Notes (Signed)
Procedure Name: LMA Insertion Date/Time: 07/28/2019 11:17 AM Performed by: Raenette Rover, CRNA Pre-anesthesia Checklist: Patient identified, Emergency Drugs available, Suction available and Patient being monitored Patient Re-evaluated:Patient Re-evaluated prior to induction Oxygen Delivery Method: Circle system utilized Preoxygenation: Pre-oxygenation with 100% oxygen Induction Type: IV induction Ventilation: Mask ventilation without difficulty LMA: LMA inserted LMA Size: 5.0 Number of attempts: 1 Placement Confirmation: positive ETCO2 and breath sounds checked- equal and bilateral Tube secured with: Tape Dental Injury: Teeth and Oropharynx as per pre-operative assessment

## 2019-07-28 NOTE — Transfer of Care (Signed)
Immediate Anesthesia Transfer of Care Note  Patient: Danny Duran  Procedure(s) Performed: RADIOACTIVE SEED IMPLANT/BRACHYTHERAPY IMPLANT (N/A Prostate) SPACE OAR INSTILLATION (N/A Rectum) CYSTOSCOPY FLEXIBLE (N/A Bladder)  Patient Location: PACU  Anesthesia Type:General  Level of Consciousness: drowsy  Airway & Oxygen Therapy: Patient Spontanous Breathing and Patient connected to nasal cannula oxygen  Post-op Assessment: Report given to RN and Post -op Vital signs reviewed and stable  Post vital signs: Reviewed and stable  Last Vitals:  Vitals Value Taken Time  BP 123/82 07/28/19 1219  Temp    Pulse 59 07/28/19 1222  Resp 17 07/28/19 1222  SpO2 96 % 07/28/19 1222  Vitals shown include unvalidated device data.  Last Pain:  Vitals:   07/28/19 0935  TempSrc: Oral  PainSc: 0-No pain      Patients Stated Pain Goal: 3 (95/97/47 1855)  Complications: No complications documented.

## 2019-07-28 NOTE — Discharge Instructions (Signed)
Radioactive Seed Implant Home Care Instructions   Activity:    Rest for the remainder of the day.  Do not drive or operate equipment today.  You may resume normal  activities in a few days as instructed by your physician, without risk of harmful radiation exposure to those around you, provided you follow the time and distance precautions on the Radiation Oncology Instruction Sheet.   Meals: Drink plenty of lipuids and eat light foods, such as gelatin or soup this evening .  You may return to normal meal plan tomorrow.  Return To Work: You may return to work as instructed by your physician.  Special Instruction:   If any seeds are found, use tweezers to pick up seeds and place in a glass container of any kind and bring to your physician's office.  Call your physician if any of these symptoms occur:   Persistent or heavy bleeding  Urine stream diminishes or stops completely after catheter is removed  Fever equal to or greater than 101 degrees F  Cloudy urine with a strong foul odor  Severe pain  You may feel some burning pain and/or hesitancy when you urinate after the catheter is removed.  These symptoms may increase over the next few weeks, but should diminish within forur to six weeks.  Applying moist heat to the lower abdomen or a hot tub bath may help relieve the pain.  If the discomfort becomes severe, please call your physician for additional medications.     Post Anesthesia Home Care Instructions  Activity: Get plenty of rest for the remainder of the day. A responsible individual must stay with you for 24 hours following the procedure.  For the next 24 hours, DO NOT: -Drive a car -Operate machinery -Drink alcoholic beverages -Take any medication unless instructed by your physician -Make any legal decisions or sign important papers.  Meals: Start with liquid foods such as gelatin or soup. Progress to regular foods as tolerated. Avoid greasy, spicy, heavy foods. If  nausea and/or vomiting occur, drink only clear liquids until the nausea and/or vomiting subsides. Call your physician if vomiting continues.  Special Instructions/Symptoms: Your throat may feel dry or sore from the anesthesia or the breathing tube placed in your throat during surgery. If this causes discomfort, gargle with warm salt water. The discomfort should disappear within 24 hours.  If you had a scopolamine patch placed behind your ear for the management of post- operative nausea and/or vomiting:  1. The medication in the patch is effective for 72 hours, after which it should be removed.  Wrap patch in a tissue and discard in the trash. Wash hands thoroughly with soap and water. 2. You may remove the patch earlier than 72 hours if you experience unpleasant side effects which may include dry mouth, dizziness or visual disturbances. 3. Avoid touching the patch. Wash your hands with soap and water after contact with the patch.     

## 2019-07-28 NOTE — Op Note (Signed)
Preoperative diagnosis: Clinical stage TI C adenocarcinoma the prostate   Postoperative diagnosis: Same   Procedure: I-125 prostate seed implantation, SpaceOAR placement, flexible cystoscopy  Surgeon: Lillette Boxer. Dmiyah Liscano M.D.  Radiation Oncologist: Tyler Pita, M.D.  Anesthesia: Gen.   Indications: Patient  was diagnosed with clinical stage TI C prostate cancer. We had extensive discussion with him about treatment options versus. He elected to proceed with seed implantation. He underwent consultation my office as well as with Dr. Tammi Klippel. He appeared to understand the advantages disadvantages potential risks of this treatment option. Full informed consent has been obtained.   Technique and findings: Patient was brought the operating room where he had successful induction of general anesthesia. He was placed in dorso-lithotomy position and prepped and draped in usual manner. Appropriate surgical timeout was performed. Radiation oncology department placed a transrectal ultrasound probe anchoring stand. Foley catheter with contrast in the balloon was inserted without difficulty. Anchoring needles were placed within the prostate. Rectal tube was placed. Real-time contouring of the urethra prostate and rectum were performed and the dosing parameters were established. Targeted dose was 145 gray.  I was then called  to the operating suite suite for placement of the needles. A second timeout was performed. All needle passage was done with real-time transrectal ultrasound guidance with the sagittal plane. A total of 22 needles were placed.  69 active seeds were implanted.   I then proceeded with placement of SpaceOAR by introducing a needle with the bevel angled inferiorly approximately 2 cm superior to the anus. This was angled downward and under direct ultrasound was placed within the space between the prostatic capsule and rectum. This was confirmed with a small amount of sterile saline injected and  this was performed under direct ultrasound. I then attached the SpaceOAR to the needle and injected this in the space between the prostate and rectum with good placement noted. The Foley catheter was removed and flexible cystoscopy failed to show any seeds outside the prostate.  The patient was brought to recovery room in stable condition, having tolerated the procedure well.Marland Kitchen

## 2019-07-29 ENCOUNTER — Encounter (HOSPITAL_BASED_OUTPATIENT_CLINIC_OR_DEPARTMENT_OTHER): Payer: Self-pay | Admitting: Urology

## 2019-08-11 ENCOUNTER — Telehealth: Payer: Self-pay | Admitting: *Deleted

## 2019-08-11 NOTE — Telephone Encounter (Signed)
RETURNED PATIENT'S PHONE CALL, SPOKE WITH PATIENT. ?

## 2019-08-19 ENCOUNTER — Telehealth: Payer: Self-pay | Admitting: *Deleted

## 2019-08-19 NOTE — Telephone Encounter (Signed)
CALLED PATIENT TO REMIND OF POST SEED APPTS. FOR 08-20-19, SPOKE WITH PATIENT'S WIFE, PEARL AND SHE IS AWARE OF THESE APPTS.

## 2019-08-20 ENCOUNTER — Ambulatory Visit
Admission: RE | Admit: 2019-08-20 | Discharge: 2019-08-20 | Disposition: A | Discharge status: Home / Self Care | Payer: Medicare Other | Source: Ambulatory Visit | Attending: Urology | Admitting: Urology

## 2019-08-20 ENCOUNTER — Encounter: Payer: Self-pay | Admitting: Urology

## 2019-08-20 ENCOUNTER — Ambulatory Visit
Admission: RE | Admit: 2019-08-20 | Discharge: 2019-08-20 | Disposition: A | Discharge status: Home / Self Care | Payer: Medicare Other | Source: Ambulatory Visit | Attending: Radiation Oncology | Admitting: Radiation Oncology

## 2019-08-20 ENCOUNTER — Other Ambulatory Visit: Payer: Self-pay

## 2019-08-20 VITALS — BP 125/76 | HR 62 | Temp 97.9°F | Resp 20 | Ht 72.0 in | Wt 223.2 lb

## 2019-08-20 DIAGNOSIS — C61 Malignant neoplasm of prostate: Secondary | ICD-10-CM

## 2019-08-20 DIAGNOSIS — Z7982 Long term (current) use of aspirin: Secondary | ICD-10-CM | POA: Insufficient documentation

## 2019-08-20 DIAGNOSIS — Z79899 Other long term (current) drug therapy: Secondary | ICD-10-CM | POA: Insufficient documentation

## 2019-08-20 NOTE — Progress Notes (Signed)
Radiation Oncology         [phone] ________________________________  Name: Danny Duran MRN: [id number]  Date: 08/20/2019  DOB: March 27, 1945  Post-Seed Follow-Up Visit Note  CC: Danny Duran, Danny Bloodgood, MD  Diagnosis:   74 y.o. gentleman with Stage T1c adenocarcinoma of the prostate with Gleason score of 3+4, and PSA of 5.59.    ICD-10-CM   1. Malignant neoplasm of prostate (HCC)  C61     Interval Since Last Radiation:  3 weeks 07/28/2019:  Insertion of radioactive I-125 seeds into the prostate gland; 145 Gy, definitive therapy with placement of SpaceOAR VUE gel.  Narrative:  The patient returns today for routine follow-up.  He is complaining of increased urinary frequency and urinary hesitation symptoms. He filled out a questionnaire regarding urinary function today providing and overall IPSS score of 14 characterizing his symptoms as moderate with increased frequency, urgency, weak stream, hesitancy and intermittency with occasional feelings of incomplete bladder emptying.  His pre-implant score was 1. He denies any abdominal pain, nausea, vomiting or diarrhea but is having occasional constipation which is managed with stool softeners prn. Overall, he is quite pleased with his progress to date.  ALLERGIES:  has No Known Allergies.  Meds: Current Outpatient Medications  Medication Sig Dispense Refill  . acetaminophen (TYLENOL) 500 MG tablet Take 500 mg by mouth every 6 (six) hours as needed.    Marland Kitchen amlodipine-atorvastatin (CADUET) 10-20 MG tablet Take 1 tablet by mouth at bedtime.     Marland Kitchen aspirin EC 81 MG tablet Take 81 mg by mouth daily.    . carvedilol (COREG) 25 MG tablet Take 25 mg by mouth 2 (two) times daily with a meal.     . colchicine 0.6 MG tablet Take 0.6 mg by mouth 2 (two) times daily as needed.     . doxazosin (CARDURA) 8 MG tablet Take 8 mg by mouth 2 (two) times daily.     Marland Kitchen losartan-hydrochlorothiazide (HYZAAR) 100-25 MG tablet Take 1 tablet by mouth  daily.     Marland Kitchen spironolactone (ALDACTONE) 25 MG tablet Take 25 mg by mouth at bedtime.      No current facility-administered medications for this visit.    Physical Findings: In general this is a well appearing African American male in no acute distress. He's alert and oriented x4 and appropriate throughout the examination. Cardiopulmonary assessment is negative for acute distress and he exhibits normal effort.   Lab Findings: Lab Results  Component Value Date   WBC 6.0 07/24/2019   HGB 14.5 07/24/2019   HCT 44.9 07/24/2019   MCV 95.3 07/24/2019   PLT 185 07/24/2019    Radiographic Findings:  Patient underwent CT imaging in our clinic for post implant dosimetry. The CT will be reviewed by Danny Duran to confirm there is an adequate distribution of radioactive seeds throughout the prostate gland and ensure that there are no seeds in or near the rectum. We suspect the final radiation plan and dosimetry will show appropriate coverage of the prostate gland. He understands that we will call and inform him of any unexpected findings on further review of his imaging and dosimetry.  Impression/Plan: 74 y.o. gentleman with Stage T1c adenocarcinoma of the prostate with Gleason score of 3+4, and PSA of 5.59. The patient is recovering from the effects of radiation. His urinary symptoms should gradually improve over the next 4-6 months. We talked about this today. He is encouraged by his improvement already and is otherwise pleased with  his outcome. We also talked about long-term follow-up for prostate cancer following seed implant. He understands that ongoing PSA determinations and digital rectal exams will help perform surveillance to rule out disease recurrence. He does not currently have a follow up appointment scheduled with Danny Duran to his knowledge. He understands what to expect with his PSA measures. Patient was also educated today about some of the long-term effects from radiation including a  small risk for rectal bleeding and possibly erectile dysfunction. We talked about some of the general management approaches to these potential complications. However, I did encourage the patient to contact our office or return at any point if he has questions or concerns related to his previous radiation and prostate cancer.  Today, a comprehensive survivorship care plan and treatment summary was reviewed with the patient today detailing his prostate cancer diagnosis, treatment course, potential late/long-term effects of treatment, appropriate follow-up care with recommendations for the future, and patient education resources.  A copy of this summary, along with a letter will be sent to the patient's primary care provider via fax after today's visit.  2. Cancer screening:  Due to Danny Duran's history and his age, he should receive screening for skin cancers, colon cancer, and lung cancer.  The information and recommendations are listed on the patient's comprehensive care plan/treatment summary and were reviewed in detail with the patient.     3. Health maintenance and wellness promotion: Danny Duran was encouraged to consume 5-7 servings of fruits and vegetables per day. He was provided a copy of the "Nutrition Rainbow" handout, as well as the handout "Take Control of Your Health and [place]" from the [employer].  He was also encouraged to engage in moderate to vigorous exercise for 30 minutes per day most days of the week. Information was provided regarding the [facility] fitness program, which is designed for cancer survivors to help them become more physically fit after cancer treatments. We discussed that a healthy BMI is 18.5-24.9 and that maintaining a healthy weight reduces risk of cancer recurrences.  He was instructed to limit his alcohol consumption and continue to abstain from tobacco use.  Lastly, he was encouraged to use sunscreen and wear protective clothing when in  the sun.     4. Support services/counseling: It is not uncommon for this period of the patient's cancer care trajectory to be one of many emotions and stressors.  Mr. Montrose was encouraged to take advantage of our many support services programs, support groups, and/or counseling in coping with his new life as a cancer survivor after completing anti-cancer treatment.  He was offered support today through active listening and expressive supportive counseling.  He was given information regarding our available services and encouraged to contact me with any questions or for help enrolling in any of our support group/programs.       Nicholos Johns, PA-C

## 2019-08-20 NOTE — Progress Notes (Signed)
Weight and vitals stable. Denies pain. Pre-seed IPSS 1. Post-seed IPSS 14. Reports incomplete emptying is his biggest complaint. Reports dysuria and hematuria have resolved. Denies urinary leakage or incontinence. Reports intermittent episodes of constipation. Reports taking a stool softener intermittently to manage constipation. Patient reports he hasn't seen Dr. Diona Fanti since his implant nor does he have a follow up scheduled.  BP 125/76   Pulse 62   Temp 97.9 F (36.6 C)   Resp 20   Ht 6' (1.829 m)   Wt 223 lb 3.2 oz (101.2 kg)   SpO2 100%   BMI 30.27 kg/m  Wt Readings from Last 3 Encounters:  08/20/19 223 lb 3.2 oz (101.2 kg)  07/28/19 (!) 220 lb 14.4 oz (100.2 kg)  07/24/19 221 lb (100.2 kg)

## 2019-08-21 ENCOUNTER — Encounter: Payer: Self-pay | Admitting: Medical Oncology

## 2019-08-21 NOTE — Progress Notes (Signed)
  Radiation Oncology         [phone] ________________________________  Name: Danny Duran MRN: [id number]  Date: 08/20/2019  DOB: [birth year]  COMPLEX SIMULATION NOTE  NARRATIVE:  The patient was brought to the [facility] today following prostate seed implantation approximately one month ago.  Identity was confirmed.  All relevant records and images related to the planned course of therapy were reviewed.  Then, the patient was set-up supine.  CT images were obtained.  The CT images were loaded into the planning software.  Then the prostate and rectum were contoured.  Treatment planning then occurred.  The implanted iodine 125 seeds were identified by the physics staff for projection of radiation distribution  I have requested : 3D Simulation  I have requested a DVH of the following structures: Prostate and rectum.    ________________________________  Sheral Apley Tammi Klippel, M.D.

## 2019-09-09 ENCOUNTER — Ambulatory Visit
Admission: RE | Admit: 2019-09-09 | Discharge: 2019-09-09 | Disposition: A | Discharge status: Home / Self Care | Payer: Medicare Other | Source: Ambulatory Visit | Attending: Radiation Oncology | Admitting: Radiation Oncology

## 2019-09-09 ENCOUNTER — Encounter: Payer: Self-pay | Admitting: Radiation Oncology

## 2019-09-09 DIAGNOSIS — C61 Malignant neoplasm of prostate: Secondary | ICD-10-CM | POA: Insufficient documentation

## 2019-10-14 NOTE — Progress Notes (Signed)
  Radiation Oncology         [phone] ________________________________  Name: [name] MRN: [id number]  Date: 09/09/2019  DOB: 1945/07/21  3D Planning Note   Prostate Brachytherapy Post-Implant Dosimetry  Diagnosis: 74 y.o. gentleman with Stage T1c adenocarcinoma of the prostate with Gleason score of 3+4, and PSA of 5.59  Narrative: On a previous date, Danny Duran returned following prostate seed implantation for post implant planning. He underwent CT scan complex simulation to delineate the three-dimensional structures of the pelvis and demonstrate the radiation distribution.  Since that time, the seed localization, and complex isodose planning with dose volume histograms have now been completed.  Results:   Prostate Coverage - The dose of radiation delivered to the 90% or more of the prostate gland (D90) was 113.16% of the prescription dose. This exceeds our goal of greater than 90%. Rectal Sparing - The volume of rectal tissue receiving the prescription dose or higher was 0.0 cc. This falls under our thresholds tolerance of 1.0 cc.  Impression: The prostate seed implant appears to show adequate target coverage and appropriate rectal sparing.  Plan:  The patient will continue to follow with urology for ongoing PSA determinations. I would anticipate a high likelihood for local tumor control with minimal risk for rectal morbidity.  ________________________________  Sheral Apley Tammi Klippel, M.D.

## 2020-03-15 IMAGING — CR DG CHEST 2V
2 series · 2 of 2 positions shown · non-contrast
Comparison: 02/13/2017

CLINICAL DATA: Sarcoidosis and asbestosis.

EXAM:
CHEST - 2 VIEW

[w chest pa]
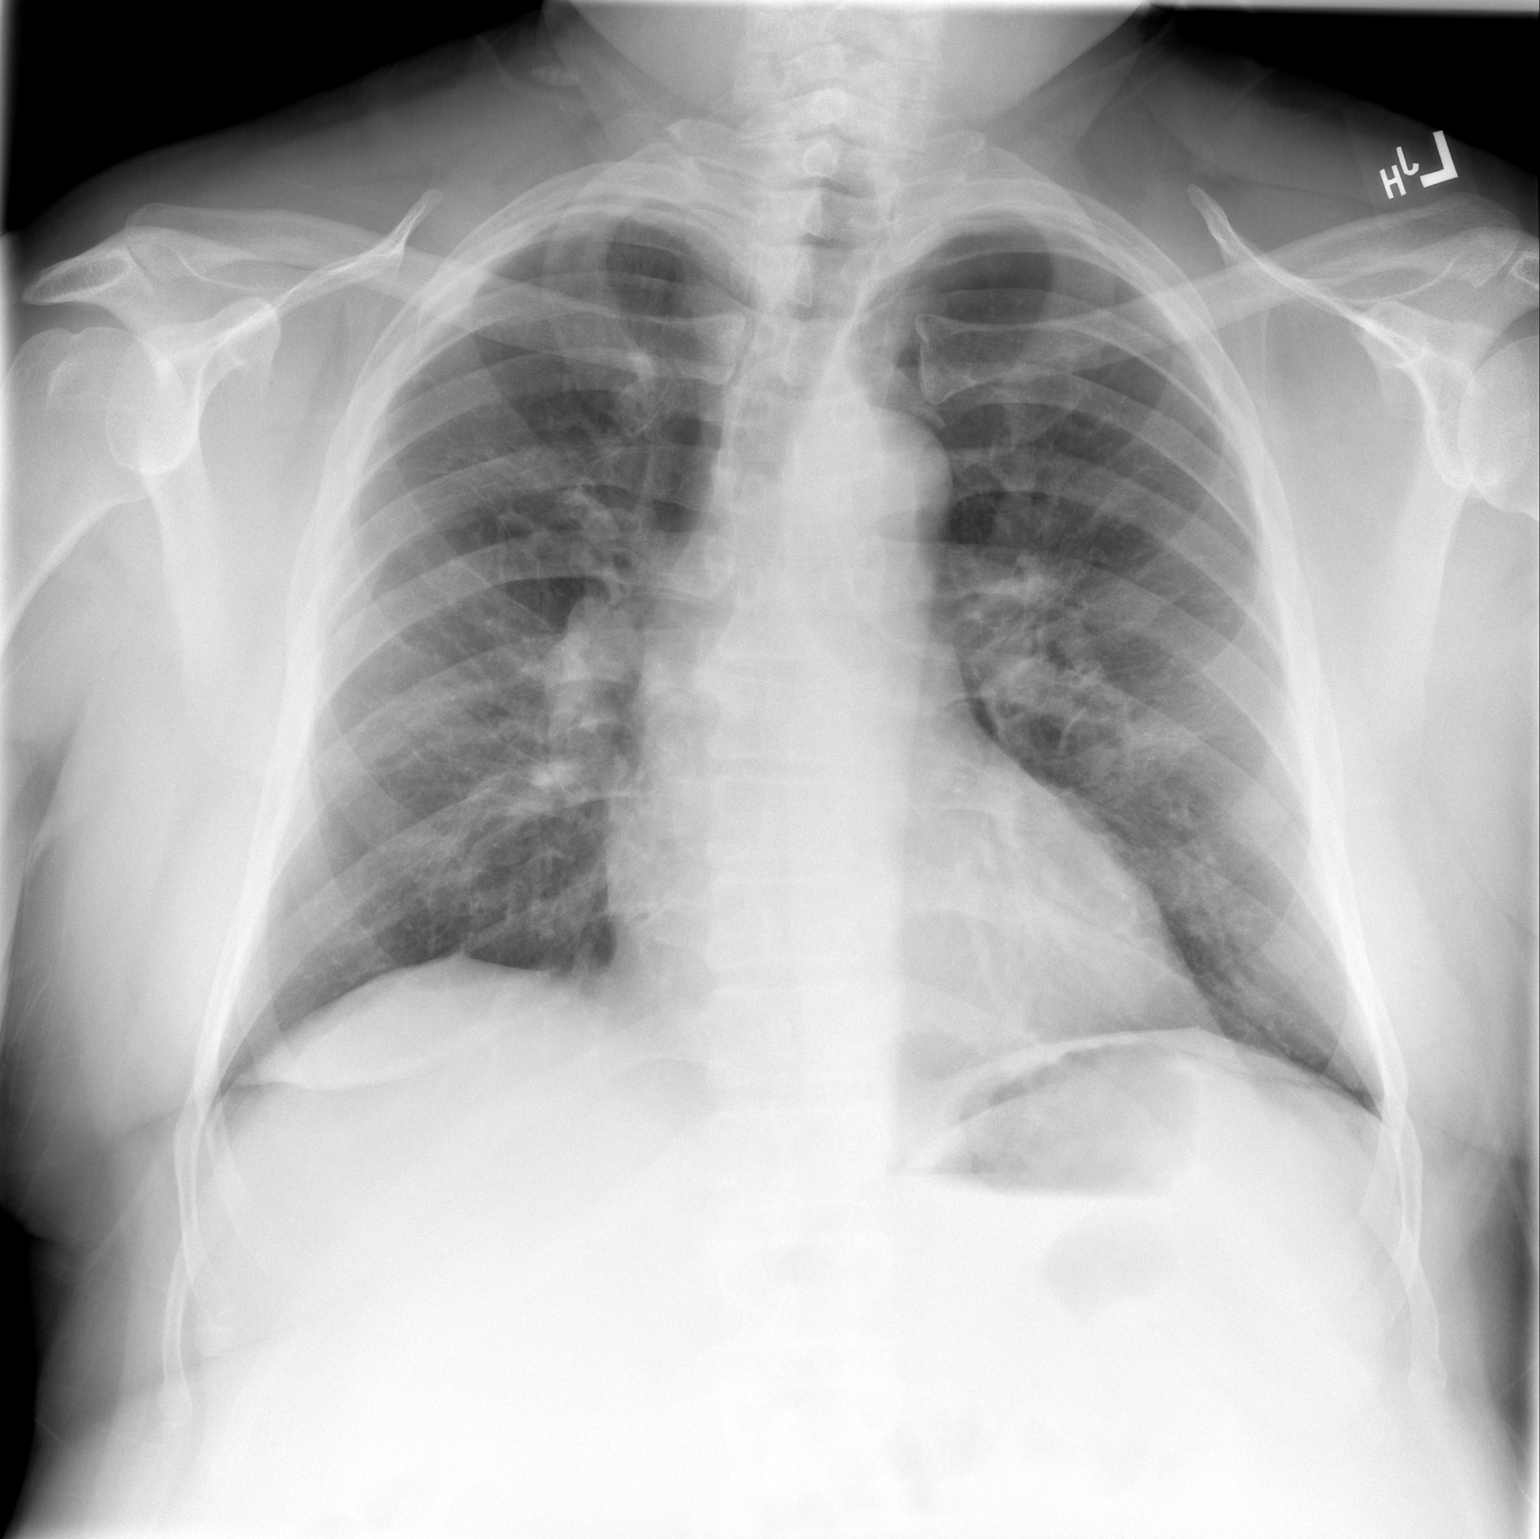

[w chest lat]
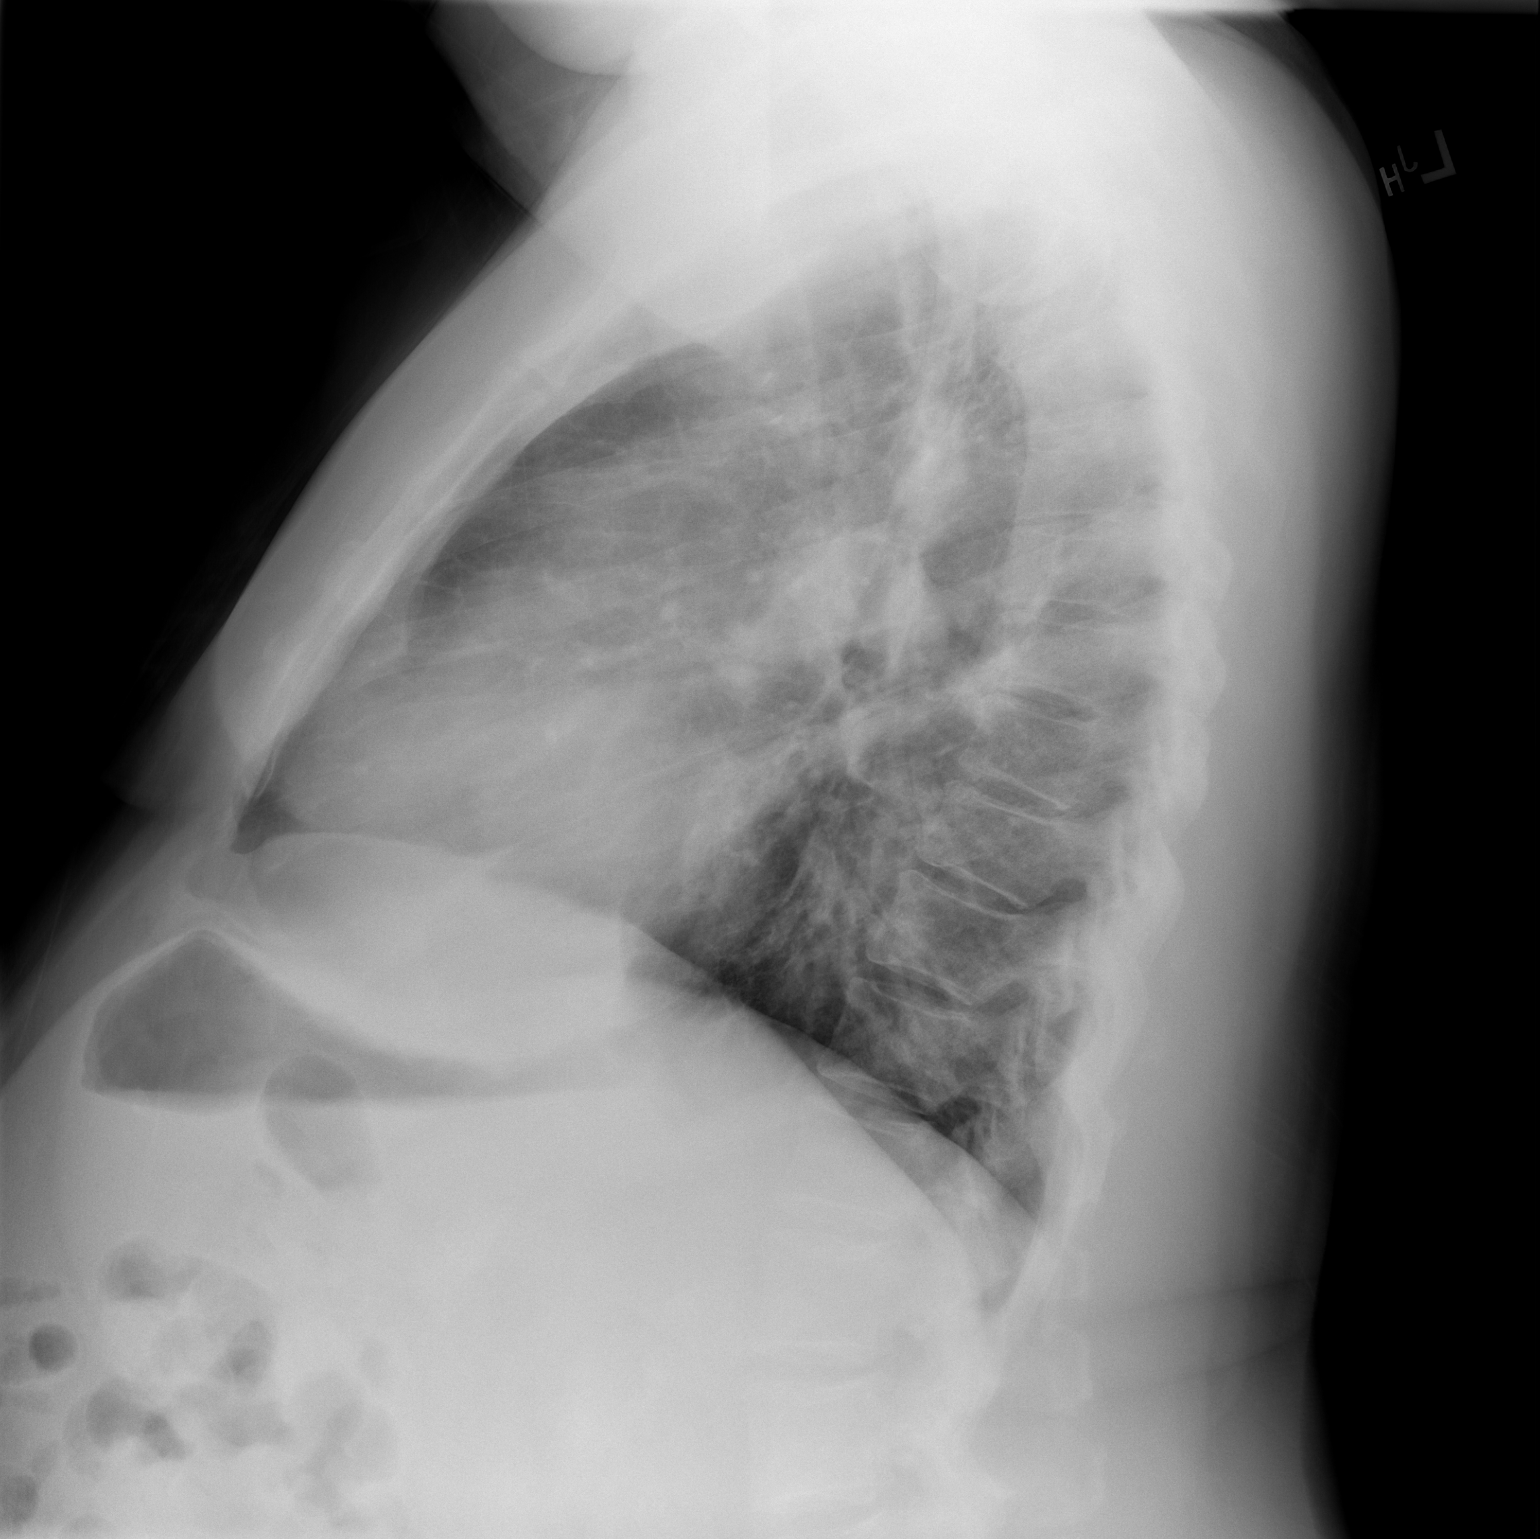

[2 of 2 positions shown; findings below may reference images not displayed]

FINDINGS: Normal heart size. Stable bilateral hilar prominence. Lungs are
under aerated and clear. No pneumothorax or pleural effusion.
IMPRESSION: Stable bilateral hilar prominence.

## 2023-01-08 ENCOUNTER — Inpatient Hospital Stay: Payer: Medicare Other

## 2023-01-08 ENCOUNTER — Other Ambulatory Visit: Payer: Self-pay | Admitting: *Deleted

## 2023-01-08 VITALS — BP 122/76 | HR 60 | Temp 97.7°F | Resp 17 | Wt 214.6 lb

## 2023-01-08 DIAGNOSIS — Z8546 Personal history of malignant neoplasm of prostate: Secondary | ICD-10-CM | POA: Diagnosis not present

## 2023-01-08 DIAGNOSIS — C61 Malignant neoplasm of prostate: Secondary | ICD-10-CM

## 2023-01-08 DIAGNOSIS — D472 Monoclonal gammopathy: Secondary | ICD-10-CM | POA: Insufficient documentation

## 2023-01-08 DIAGNOSIS — I10 Essential (primary) hypertension: Secondary | ICD-10-CM | POA: Insufficient documentation

## 2023-01-08 DIAGNOSIS — E785 Hyperlipidemia, unspecified: Secondary | ICD-10-CM | POA: Insufficient documentation

## 2023-01-08 LAB — CBC WITH DIFFERENTIAL (CANCER CENTER ONLY)
Abs Immature Granulocytes: 0.02 10*3/uL (ref 0.00–0.07)
Basophils Absolute: 0 10*3/uL (ref 0.0–0.1)
Basophils Relative: 0 %
Eosinophils Absolute: 0.1 10*3/uL (ref 0.0–0.5)
Eosinophils Relative: 2 %
HCT: 43.4 % (ref 39.0–52.0)
Hemoglobin: 14.6 g/dL (ref 13.0–17.0)
Immature Granulocytes: 0 %
Lymphocytes Relative: 23 %
Lymphs Abs: 1.6 10*3/uL (ref 0.7–4.0)
MCH: 31.1 pg (ref 26.0–34.0)
MCHC: 33.6 g/dL (ref 30.0–36.0)
MCV: 92.3 fL (ref 80.0–100.0)
Monocytes Absolute: 0.6 10*3/uL (ref 0.1–1.0)
Monocytes Relative: 8 %
Neutro Abs: 4.5 10*3/uL (ref 1.7–7.7)
Neutrophils Relative %: 67 %
Platelet Count: 179 10*3/uL (ref 150–400)
RBC: 4.7 MIL/uL (ref 4.22–5.81)
RDW: 13.5 % (ref 11.5–15.5)
WBC Count: 6.8 10*3/uL (ref 4.0–10.5)
nRBC: 0 % (ref 0.0–0.2)

## 2023-01-08 LAB — CMP (CANCER CENTER ONLY)
ALT: 27 U/L (ref 0–44)
AST: 20 U/L (ref 15–41)
Albumin: 4.4 g/dL (ref 3.5–5.0)
Alkaline Phosphatase: 54 U/L (ref 38–126)
Anion gap: 5 (ref 5–15)
BUN: 27 mg/dL — ABNORMAL HIGH (ref 8–23)
CO2: 27 mmol/L (ref 22–32)
Calcium: 11.2 mg/dL — ABNORMAL HIGH (ref 8.9–10.3)
Chloride: 108 mmol/L (ref 98–111)
Creatinine: 1.48 mg/dL — ABNORMAL HIGH (ref 0.61–1.24)
GFR, Estimated: 48 mL/min — ABNORMAL LOW (ref >60)
Glucose, Bld: 94 mg/dL (ref 70–99)
Potassium: 4.3 mmol/L (ref 3.5–5.1)
Sodium: 140 mmol/L (ref 135–145)
Total Bilirubin: 0.7 mg/dL (ref 0.0–1.2)
Total Protein: 7.4 g/dL (ref 6.5–8.1)

## 2023-01-08 LAB — LACTATE DEHYDROGENASE: LDH: 157 U/L (ref 98–192)

## 2023-01-08 NOTE — Progress Notes (Signed)
 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  He has low level M proteins reported once. Discuss need to complete testing. Discussed with patient what MGUS is vs SMM and MM. Recommend additional testing for evaluation. To rule out multiple myeloma, I recommend complete blood work, and myeloma bone survey to rule out multiple myeloma  Depending on test results, we may or may not proceed with bone marrow aspirate and biopsy.  Patient is fine with proceeding with bone marrow biopsy if needed.  If testing showing MGUS we will need long term monitoring.  If testing showed active myeloma, treatment will be discussed.  Orders as below:  Orders Placed This Encounter  Procedures   DG Bone Survey Met    Standing Status:   Future    Expected Date:   01/15/2023    Expiration Date:   01/08/2024    Reason for Exam (SYMPTOM  OR DIAGNOSIS REQUIRED):   staging myeloma    Preferred imaging location?:   [facility]   IgG, IgA, IgM    Standing Status:   Future    Number of Occurrences:   1    Expiration Date:   01/08/2024   Kappa/lambda light chains    Standing Status:   Future    Number of Occurrences:   1    Expiration Date:   01/08/2024   Serum protein electrophoresis with reflex    Standing Status:   Future    Number of Occurrences:   1    Expiration Date:   01/08/2024   Lactate dehydrogenase    Standing Status:   Future    Number of Occurrences:   1    Expiration Date:   01/08/2024   CBC with Differential (Cancer Center Only)    Standing Status:   Future    Number of Occurrences:   1    Expiration Date:   01/08/2024   CMP (Cancer Center only)    Standing Status:   Future    Number of Occurrences:   1    Expiration Date:   01/08/2024   Tentative  follow-up telephone visit to discuss results in 3 to 4 weeks.  All questions were answered. The patient knows to call the clinic with any problems, questions or concerns. No barriers to learning was detected.  [name] Chihuahua, MD 1/6/202512:13 PM  CHIEF COMPLAINTS/PURPOSE OF CONSULTATION:  Abnormal M protein  HISTORY OF PRESENTING ILLNESS:  Danny Duran 78 y.o. male is here because of abnormal proteins. Report M spike of 0.6. No elevation in IGG, IGA or IGM.  Cr over 2024 peaked at 1.5. Calcium  11.2 in May 2024 with albumin of 4.2. total protein of 7.1 and PTH was normal. In May 2024. Hgb 13.8  He reports no abdominal pain, sometimes has constipation. No loss of appetite or weight loss, nausea, vomiting. No difficulty urinating.  No new bone or pain. He right knee pain for about a few years.   MEDICAL HISTORY:  Past Medical History:  Diagnosis Date   Chronic gout    07-18-2019  per pt last episode left great toe approx. 11/ 2020   Hyperlipidemia    Hypertension    followed by pcp   (07-18-2019 per pt had one yrs  ago, was told ok)   Nocturia    Prostate cancer [facility]) urologist--- dr dahlstedt/  oncology-- dr patrcia   dx 04/ 2021  Stage T1c, Gleason 3+4   RBBB (right bundle branch block)    Thrombocytopenia (HCC)    Wears glasses     SURGICAL HISTORY: Past Surgical History:  Procedure Laterality Date   CYSTOSCOPY N/A 07/28/2019   Procedure: CYSTOSCOPY FLEXIBLE;  Surgeon: Matilda Senior, MD;  [facility];  Service: Urology;  Laterality: N/A;   NO PAST SURGERIES     PROSTATE BIOPSY     RADIOACTIVE SEED IMPLANT N/A 07/28/2019   Procedure: RADIOACTIVE SEED IMPLANT/BRACHYTHERAPY IMPLANT;  Surgeon: Matilda Senior, MD;  Location: [facility];  Service: Urology;  Laterality: N/A;  90 MINS   SPACE OAR INSTILLATION N/A 07/28/2019   Procedure: SPACE OAR INSTILLATION;  Surgeon: Matilda Senior, MD;  Location: [facility];  Service: Urology;  Laterality: N/A;    SOCIAL HISTORY: Social History   Socioeconomic History   Marital status: Married    Spouse name: Not on file   Number of children: 2   Years of education: Not on file   Highest education level: Not on file  Occupational History   Not on file  Tobacco Use   Smoking status: Never   Smokeless tobacco: Never  Vaping Use   Vaping status: Never Used  Substance and Sexual Activity   Alcohol use: Yes    Comment: occasional   Drug use: Never   Sexual activity: Yes  Other Topics Concern   Not on file  Social History Narrative   Not on file   Social Drivers of Health   Financial Resource Strain: Not on file  Food Insecurity: Not on file  Transportation Needs: Not on file  Physical Activity: Not on file  Stress: Not on file  Social Connections: Not on file  Intimate Partner Violence: Not on file    FAMILY HISTORY: Family History  Problem Relation Age of Onset   Heart disease Mother    Heart disease Father    Breast cancer Neg Hx    Prostate cancer Neg Hx    Colon cancer Neg Hx    Pancreatic cancer Neg Hx     ALLERGIES:  has no known allergies.  MEDICATIONS:  Current Outpatient Medications  Medication Sig Dispense Refill   allopurinol (ZYLOPRIM) 100 MG tablet Take 150 mg by mouth daily.     amlodipine -atorvastatin (CADUET) 10-20 MG tablet Take 1 tablet by mouth at bedtime.      aspirin EC 81 MG tablet Take 81 mg by mouth daily.     carvedilol (COREG) 25 MG tablet Take 25 mg by mouth 2 (two) times daily with a meal.      doxazosin (CARDURA) 4 MG tablet Take by mouth.     olmesartan (BENICAR) 40 MG tablet Take 40 mg by mouth daily.     spironolactone (ALDACTONE) 25 MG tablet Take 25 mg by mouth at bedtime.      No current facility-administered medications for this visit.    REVIEW OF SYSTEMS:   All relevant systems were reviewed with the patient and are negative.  PHYSICAL EXAMINATION: ECOG PERFORMANCE STATUS: 0 -  Asymptomatic  Vitals:   01/08/23 1053  BP: 122/76  Pulse: 60  Resp: 17  Temp: 97.7 F (36.5 C)  SpO2: 100%   Filed Weights   01/08/23 1053  Weight: 214 lb 9.6 oz (97.3 kg)  GENERAL: alert, no distress and comfortable SKIN: skin color is normal, no jaundice, rashes or significant lesions EYES: sclera clear OROPHARYNX: no exudate, no erythema and no enlarged tongue NECK: supple LYMPH:  no palpable lymphadenopathy in the cervical regions LUNGS: Effort normal, no respiratory distress.  Clear to auscultation bilaterally HEART: regular rate & rhythm and no lower extremity edema ABDOMEN: soft, non-tender and nondistended Musculoskeletal: no point tenderness  LABORATORY DATA:  I have reviewed the data as listed Lab Results  Component Value Date   WBC 6.8 01/08/2023   HGB 14.6 01/08/2023   HCT 43.4 01/08/2023   MCV 92.3 01/08/2023   PLT 179 01/08/2023   No results for input(s): NA, K, CL, CO2, GLUCOSE, BUN, CREATININE, CALCIUM , GFRNONAA, GFRAA, PROT, ALBUMIN, AST, ALT, ALKPHOS, BILITOT, BILIDIR, IBILI in the last 8760 hours.  RADIOGRAPHIC STUDIES: I have personally reviewed the radiological images as listed and agreed with the findings in the report. No results found.

## 2023-01-08 NOTE — Patient Instructions (Signed)
 We will check your lab for abnormal protein today.  You will get a call from Radiology scheduling for a whole body X ray. If no concerning findings, will discuss in 3 weeks and follow up in about 3 months with repeat testing.  We will have a phone call at the end of the month.

## 2023-01-08 NOTE — Assessment & Plan Note (Deleted)
 SPEP, FLCs and Igs CBC, CMP, LDH

## 2023-01-09 LAB — IGG, IGA, IGM
IgA: 137 mg/dL (ref 61–437)
IgG (Immunoglobin G), Serum: 1312 mg/dL (ref 603–1613)
IgM (Immunoglobulin M), Srm: 11 mg/dL — ABNORMAL LOW (ref 15–143)

## 2023-01-09 LAB — KAPPA/LAMBDA LIGHT CHAINS
Kappa free light chain: 120.7 mg/L — ABNORMAL HIGH (ref 3.3–19.4)
Kappa, lambda light chain ratio: 8.81 — ABNORMAL HIGH (ref 0.26–1.65)
Lambda free light chains: 13.7 mg/L (ref 5.7–26.3)

## 2023-01-09 LAB — PARATHYROID HORMONE, INTACT (NO CA): PTH: 37 pg/mL (ref 15–65)

## 2023-01-16 LAB — PROTEIN ELECTROPHORESIS, SERUM, WITH REFLEX
A/G Ratio: 1.3 (ref 0.7–1.7)
Albumin ELP: 3.9 g/dL (ref 2.9–4.4)
Alpha-1-Globulin: 0.2 g/dL (ref 0.0–0.4)
Alpha-2-Globulin: 0.7 g/dL (ref 0.4–1.0)
Beta Globulin: 0.9 g/dL (ref 0.7–1.3)
Gamma Globulin: 1.1 g/dL (ref 0.4–1.8)
Globulin, Total: 3 g/dL (ref 2.2–3.9)
M-Spike, %: 0.5 g/dL — ABNORMAL HIGH
SPEP Interpretation: 0
Total Protein ELP: 6.9 g/dL (ref 6.0–8.5)

## 2023-01-16 LAB — IMMUNOFIXATION REFLEX, SERUM
IgA: 152 mg/dL (ref 61–437)
IgG (Immunoglobin G), Serum: 1485 mg/dL (ref 603–1613)
IgM (Immunoglobulin M), Srm: 11 mg/dL — ABNORMAL LOW (ref 15–143)

## 2023-01-18 ENCOUNTER — Ambulatory Visit (HOSPITAL_COMMUNITY)
Admission: RE | Admit: 2023-01-18 | Discharge: 2023-01-18 | Disposition: A | Discharge status: Home / Self Care | Payer: Medicare Other | Source: Ambulatory Visit

## 2023-01-18 DIAGNOSIS — D472 Monoclonal gammopathy: Secondary | ICD-10-CM | POA: Insufficient documentation

## 2023-01-26 ENCOUNTER — Encounter: Payer: Self-pay | Admitting: Cardiology

## 2023-01-26 ENCOUNTER — Ambulatory Visit: Payer: Medicare Other | Attending: Cardiology | Admitting: Cardiology

## 2023-01-26 ENCOUNTER — Encounter: Payer: Self-pay | Admitting: *Deleted

## 2023-01-26 VITALS — BP 124/64 | HR 61 | Resp 17 | Ht 72.0 in | Wt 216.0 lb

## 2023-01-26 DIAGNOSIS — R6889 Other general symptoms and signs: Secondary | ICD-10-CM | POA: Diagnosis not present

## 2023-01-26 DIAGNOSIS — R9431 Abnormal electrocardiogram [ECG] [EKG]: Secondary | ICD-10-CM | POA: Insufficient documentation

## 2023-01-26 DIAGNOSIS — I444 Left anterior fascicular block: Secondary | ICD-10-CM | POA: Diagnosis not present

## 2023-01-26 DIAGNOSIS — I4510 Unspecified right bundle-branch block: Secondary | ICD-10-CM | POA: Diagnosis not present

## 2023-01-26 DIAGNOSIS — E782 Mixed hyperlipidemia: Secondary | ICD-10-CM | POA: Insufficient documentation

## 2023-01-26 DIAGNOSIS — I10 Essential (primary) hypertension: Secondary | ICD-10-CM | POA: Insufficient documentation

## 2023-01-26 NOTE — Patient Instructions (Addendum)
Medication Instructions:   Your physician recommends that you continue on your current medications as directed. Please refer to the Current Medication list given to you today.  *If you need a refill on your cardiac medications before your next appointment, please call your pharmacy*     Testing/Procedures:  Your physician has requested that you have an echocardiogram. Echocardiography is a painless test that uses sound waves to create images of your heart. It provides your doctor with information about the size and shape of your heart and how well your heart's chambers and valves are working. This procedure takes approximately one hour. There are no restrictions for this procedure. Please do NOT wear cologne, perfume, aftershave, or lotions (deodorant is allowed). Please arrive 15 minutes prior to your appointment time.  Please note: We ask at that you not bring children with you during ultrasound (echo/ vascular) testing. Due to room size and safety concerns, children are not allowed in the ultrasound rooms during exams. Our front office staff cannot provide observation of children in our lobby area while testing is being conducted. An adult accompanying a patient to their appointment will only be allowed in the ultrasound room at the discretion of the ultrasound technician under special circumstances. We apologize for any inconvenience.   Your physician has requested that you have a lexiscan myoview. For further information please visit [email or web address] Please follow instruction sheet, as given.    CARDIAC CALCIUM SCORE (SELF PAY)    Follow-Up:  3 MONTHS WITH DR. PATWARDHAN OR AN EXTENDER

## 2023-01-26 NOTE — Progress Notes (Signed)
Cardiology Office Note:  .   Date:  01/26/2023  ID:  Danny Duran, DOB 05/07/1945, MRN [id number] PCP: Charlane Ferretti, DO  [facility] Providers Cardiologist:  Truett Mainland, MD PCP: Charlane Ferretti, DO  Chief Complaint  Patient presents with   Fatigue   New Patient (Initial Visit)      History of Present Illness: .    Danny Duran is a 78 y.o. male with hypertension, hyperlipidemia, abnormal EKG, decreased exercise tolerance.  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 He reports feeling 'a little tired' and lacking the stamina he feels he should have, particularly when doing yard work. He also reports going to the bathroom every other day and sometimes experiencing constipation. He denies chest pain but admits to occasional shortness of breath when doing something strenuous.  The patient is active, walking two miles in about forty minutes two to three times a week at the [facility]. He denies experiencing any chest pain or shortness of breath during these walks.  He has a history of hypertension, which is well-controlled with medication. He has been taking baby aspirin for several years for prevention, as recommended by his previous doctor. He denies any history of heart attack or stroke. He had a colonoscopy about four years ago, which revealed a few polyps but no bleeding or blood in the stool. He has a family history of cardiac arrest, with a sister who passed away at the age of 30. He denies smoking and reports occasional alcohol consumption.  Vitals:   01/26/23 0957  BP: 124/64  Pulse: 61  Resp: 17  SpO2: 97%     ROS:  Review of Systems  Constitutional: Positive for malaise/fatigue.  Cardiovascular:  Negative for chest pain, dyspnea on exertion, leg swelling, palpitations and syncope.       Decreased exercise tolerance     Studies Reviewed: Marland Kitchen        EKG 01/26/2023: Sinus  bradycardia Right bundle branch block Left anterior fascicular block Bifascicular block Minimal voltage criteria for LVH, may be normal variant ( R in aVL ) When compared with ECG of 26-Jun-2019 11:02, No significant change since    Independently interpreted 01/2023: Hb 14.6 Cr 1.48 TSH 1.9  05/2022: Chol 170, TG 111, HDL 34, LDL 114 HbA1C 5.7% Hb 14.6    Physical Exam:   Physical Exam Vitals and nursing note reviewed.  Constitutional:      General: He is not in acute distress. Neck:     Vascular: No JVD.  Cardiovascular:     Rate and Rhythm: Normal rate and regular rhythm.     Heart sounds: Normal heart sounds. No murmur heard. Pulmonary:     Effort: Pulmonary effort is normal.     Breath sounds: Normal breath sounds. No wheezing or rales.      VISIT DIAGNOSES:   ICD-10-CM   1. Abnormal EKG  R94.31 EKG 12-Lead    ECHOCARDIOGRAM COMPLETE    MYOCARDIAL PERFUSION IMAGING    2. Decreased exercise tolerance  R68.89 ECHOCARDIOGRAM COMPLETE    MYOCARDIAL PERFUSION IMAGING    Cardiac Stress Test: Informed Consent Details: Physician/Practitioner Attestation; Transcribe to consent form and obtain patient signature    3. 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 Mixed hyperlipidemia  E78.2 CT CARDIAC SCORING (  SELF PAY ONLY)    6. Primary hypertension  I10     7. Abnormal electrocardiogram  R94.31 MYOCARDIAL PERFUSION IMAGING       ASSESSMENT AND PLAN: .    Danny Duran is a 78 y.o. male with hypertension, hyperlipidemia, abnormal EKG, decreased exercise tolerance.  Decreased exercise tolerance: Reports feeling tired and lacking stamina during strenuous activities such as yard work. No chest pain or shortness of breath during regular activities. Abnormal resting EKG with electrical wear and tear, but no acute  changes. Recommend echocardiogram, Lexiscan nuclear stress test, calcium score scanning. If no ischemia on stress testing or significant calcification left main artery, could consider stopping aspirin.  Hypertension: Well controlled on current medication regimen. Continue current antihypertensive medications.  Mixed hyperlipidemia: Slightly abnormal cholesterol levels. -Consider starting cholesterol-lowering medication depending on results of CT calcium score scan.  Follow-up in 3 months to review test results and adjust treatment plan as necessary. If significant abnormalities are found, patient will be contacted sooner.  Informed Consent   Shared Decision Making/Informed Consent The risks [chest pain, shortness of breath, cardiac arrhythmias, dizziness, blood pressure fluctuations, myocardial infarction, stroke/transient ischemic attack, nausea, vomiting, allergic reaction, radiation exposure, metallic taste sensation and life-threatening complications (estimated to be 1 in 10,000)], benefits (risk stratification, diagnosing coronary artery disease, treatment guidance) and alternatives of a nuclear stress test were discussed in detail with Mr. Howdeshell and he agrees to proceed.        F/u in 3 months  Signed, Elder Negus, MD

## 2023-01-29 ENCOUNTER — Other Ambulatory Visit: Payer: Self-pay

## 2023-01-29 ENCOUNTER — Telehealth (HOSPITAL_COMMUNITY): Payer: Self-pay | Admitting: *Deleted

## 2023-01-29 DIAGNOSIS — M898X9 Other specified disorders of bone, unspecified site: Secondary | ICD-10-CM

## 2023-01-29 DIAGNOSIS — M899 Disorder of bone, unspecified: Secondary | ICD-10-CM

## 2023-01-29 NOTE — Telephone Encounter (Signed)
Patient given detailed instructions per Myocardial Perfusion Study Information Sheet for the test on 02/05/2023 at 10:00. Patient notified to arrive 15 minutes early and that it is imperative to arrive on time for appointment to keep from having the test rescheduled.  If you need to cancel or reschedule your appointment, please call the office within 24 hours of your appointment. . Patient verbalized understanding.Danny Duran

## 2023-01-29 NOTE — Progress Notes (Signed)
I called and spoke patient about lab results.  We also discussed x-ray findings.  Recommend proceed with bone marrow biopsy, and MRI of the C-spine for lytic lesion.  Orders placed.  Please change appointment for next week to 2/20 at 11AM.  Thank you.

## 2023-02-02 ENCOUNTER — Telehealth: Payer: Medicare Other

## 2023-02-05 ENCOUNTER — Ambulatory Visit (HOSPITAL_COMMUNITY): Payer: Medicare Other | Attending: Cardiology

## 2023-02-05 DIAGNOSIS — I444 Left anterior fascicular block: Secondary | ICD-10-CM | POA: Diagnosis present

## 2023-02-05 DIAGNOSIS — I4510 Unspecified right bundle-branch block: Secondary | ICD-10-CM | POA: Diagnosis present

## 2023-02-05 DIAGNOSIS — R9431 Abnormal electrocardiogram [ECG] [EKG]: Secondary | ICD-10-CM | POA: Diagnosis not present

## 2023-02-05 DIAGNOSIS — R6889 Other general symptoms and signs: Secondary | ICD-10-CM

## 2023-02-05 LAB — MYOCARDIAL PERFUSION IMAGING
LV dias vol: 98 mL (ref 62–150)
LV sys vol: 48 mL
Nuc Stress EF: 51 %
Peak HR: 81 {beats}/min
Rest HR: 58 {beats}/min
Rest Nuclear Isotope Dose: 10.8 mCi
SDS: 1
SRS: 0
SSS: 1
ST Depression (mm): 0 mm
Stress Nuclear Isotope Dose: 30.7 mCi
TID: 1.07

## 2023-02-05 MED ORDER — TECHNETIUM TC 99M TETROFOSMIN IV KIT
30.7000 | PACK | Freq: Once | INTRAVENOUS | Status: AC | PRN
  Administered 2023-02-05: 30.7 via INTRAVENOUS

## 2023-02-05 MED ORDER — REGADENOSON 0.4 MG/5ML IV SOLN
0.4000 mg | Freq: Once | INTRAVENOUS | Status: AC
  Administered 2023-02-05: 0.4 mg via INTRAVENOUS

## 2023-02-05 MED ORDER — TECHNETIUM TC 99M TETROFOSMIN IV KIT
10.8000 | PACK | Freq: Once | INTRAVENOUS | Status: AC | PRN
  Administered 2023-02-05: 10.8 via INTRAVENOUS

## 2023-02-07 ENCOUNTER — Ambulatory Visit (HOSPITAL_COMMUNITY)
Admission: RE | Admit: 2023-02-07 | Discharge: 2023-02-07 | Disposition: A | Discharge status: Home / Self Care | Payer: Self-pay | Source: Ambulatory Visit | Attending: Cardiology | Admitting: Cardiology

## 2023-02-07 DIAGNOSIS — E782 Mixed hyperlipidemia: Secondary | ICD-10-CM | POA: Insufficient documentation

## 2023-02-08 ENCOUNTER — Other Ambulatory Visit: Payer: Self-pay

## 2023-02-08 ENCOUNTER — Encounter (HOSPITAL_COMMUNITY): Payer: Self-pay

## 2023-02-08 ENCOUNTER — Ambulatory Visit (HOSPITAL_COMMUNITY)
Admission: RE | Admit: 2023-02-08 | Discharge: 2023-02-08 | Disposition: A | Discharge status: Home / Self Care | Payer: Medicare Other | Source: Ambulatory Visit

## 2023-02-08 VITALS — BP 152/84 | HR 55 | Temp 97.3°F | Resp 18 | Ht 72.0 in | Wt 216.0 lb

## 2023-02-08 DIAGNOSIS — D696 Thrombocytopenia, unspecified: Secondary | ICD-10-CM | POA: Insufficient documentation

## 2023-02-08 DIAGNOSIS — Z8546 Personal history of malignant neoplasm of prostate: Secondary | ICD-10-CM | POA: Insufficient documentation

## 2023-02-08 DIAGNOSIS — D472 Monoclonal gammopathy: Secondary | ICD-10-CM | POA: Diagnosis not present

## 2023-02-08 DIAGNOSIS — M899 Disorder of bone, unspecified: Secondary | ICD-10-CM | POA: Diagnosis not present

## 2023-02-08 DIAGNOSIS — I10 Essential (primary) hypertension: Secondary | ICD-10-CM | POA: Insufficient documentation

## 2023-02-08 DIAGNOSIS — C9000 Multiple myeloma not having achieved remission: Secondary | ICD-10-CM | POA: Insufficient documentation

## 2023-02-08 DIAGNOSIS — C61 Malignant neoplasm of prostate: Secondary | ICD-10-CM | POA: Diagnosis present

## 2023-02-08 LAB — CBC WITH DIFFERENTIAL/PLATELET
Abs Immature Granulocytes: 0.02 10*3/uL (ref 0.00–0.07)
Basophils Absolute: 0 10*3/uL (ref 0.0–0.1)
Basophils Relative: 0 %
Eosinophils Absolute: 0.1 10*3/uL (ref 0.0–0.5)
Eosinophils Relative: 2 %
HCT: 42.5 % (ref 39.0–52.0)
Hemoglobin: 13.5 g/dL (ref 13.0–17.0)
Immature Granulocytes: 0 %
Lymphocytes Relative: 23 %
Lymphs Abs: 1.3 10*3/uL (ref 0.7–4.0)
MCH: 29.7 pg (ref 26.0–34.0)
MCHC: 31.8 g/dL (ref 30.0–36.0)
MCV: 93.6 fL (ref 80.0–100.0)
Monocytes Absolute: 0.6 10*3/uL (ref 0.1–1.0)
Monocytes Relative: 11 %
Neutro Abs: 3.6 10*3/uL (ref 1.7–7.7)
Neutrophils Relative %: 64 %
Platelets: 208 10*3/uL (ref 150–400)
RBC: 4.54 MIL/uL (ref 4.22–5.81)
RDW: 13.2 % (ref 11.5–15.5)
WBC: 5.6 10*3/uL (ref 4.0–10.5)
nRBC: 0 % (ref 0.0–0.2)

## 2023-02-08 MED ORDER — MIDAZOLAM HCL 2 MG/2ML IJ SOLN
INTRAMUSCULAR | Status: AC | PRN
  Administered 2023-02-08 (×4): 1 mg via INTRAVENOUS

## 2023-02-08 MED ORDER — MIDAZOLAM HCL 2 MG/2ML IJ SOLN
INTRAMUSCULAR | Status: AC
  Filled 2023-02-08: qty 4

## 2023-02-08 MED ORDER — FENTANYL CITRATE (PF) 100 MCG/2ML IJ SOLN
INTRAMUSCULAR | Status: AC
  Filled 2023-02-08: qty 2

## 2023-02-08 MED ORDER — FENTANYL CITRATE (PF) 100 MCG/2ML IJ SOLN
INTRAMUSCULAR | Status: AC | PRN
  Administered 2023-02-08 (×2): 50 ug via INTRAVENOUS

## 2023-02-08 NOTE — Discharge Instructions (Signed)

## 2023-02-08 NOTE — Procedures (Signed)
 Interventional Radiology Procedure Note  Procedure: CT guided bone marrow aspiration and biopsy  Complications: None  EBL: < 10 mL  Findings: Aspirate and core biopsy performed of bone marrow in right iliac bone.  Plan: Bedrest supine x 1 hrs  Ericha Whittingham T. Fredia Sorrow, M.D Pager:  [phone]

## 2023-02-08 NOTE — H&P (Signed)
 Chief Complaint: Patient was seen in consultation today for prostate CA, elevated kappa free light chain at the request of Chang,Rubens C  Referring Physician(s): Chang,Rubens C  Supervising Physician: Luverne Aran  Patient Status: [name] - Out-pt  History of Present Illness: Danny Duran is a 78 y.o. male with PMHs of HTN, thrombocytopenia, and elevated kappa free light chain presents for BMBx.   Patient is being followed by med/rad onc for his prostate CA as well as MUGS, additional testing was recommended on 1624 and labs showed elevated kappa free light chain and low IgM. Bone survey on 01/26/23 showed lucency within the C5 and C6 vertebral bodies. MR C spine and BMBx was recommended to the patient fir further eval which he decided to proceed.   Patient laying in bed, not in acute distress.  Denise headache, fever, chills, shortness of breath, cough, chest pain, abdominal pain, nausea ,vomiting, and bleeding.  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 The procedure is scheduled for 11 so the patient would be NPO for 6 hours bty then, patient is willing to get the procedure done with one drug only if it is neccessary. Dr. Luverne and IR sedation RN notified.    Past Medical History:  Diagnosis Date   Chronic gout    07-18-2019  per pt last episode left great toe approx. 11/ 2020   Hyperlipidemia    Hypertension    followed by pcp   (07-18-2019 per pt had one yrs ago, was told ok)   Nocturia    Prostate cancer [facility]) urologist--- dr dahlstedt/  oncology-- dr patrcia   dx 04/ 2021  Stage T1c, Gleason 3+4   RBBB (right bundle branch block)    Thrombocytopenia (HCC)    Wears glasses     Past Surgical History:  Procedure Laterality Date   CYSTOSCOPY N/A 07/28/2019   Procedure: CYSTOSCOPY FLEXIBLE;  Surgeon: Matilda Senior, MD;  Location: [facility];  Service: Urology;  Laterality: N/A;   NO PAST SURGERIES      PROSTATE BIOPSY     RADIOACTIVE SEED IMPLANT N/A 07/28/2019   Procedure: RADIOACTIVE SEED IMPLANT/BRACHYTHERAPY IMPLANT;  Surgeon: Matilda Senior, MD;  Location: [facility];  Service: Urology;  Laterality: N/A;  90 MINS   SPACE OAR INSTILLATION N/A 07/28/2019   Procedure: SPACE OAR INSTILLATION;  Surgeon: Matilda Senior, MD;  Location: [facility];  Service: Urology;  Laterality: N/A;    Allergies: Patient has no known allergies.  Medications: Prior to Admission medications   Medication Sig Start Date End Date Taking? Authorizing Provider  allopurinol (ZYLOPRIM) 100 MG tablet Take 150 mg by mouth daily.    [provider]  amlodipine -atorvastatin (CADUET) 10-20 MG tablet Take 1 tablet by mouth at bedtime.  05/15/16   [provider]  aspirin EC 81 MG tablet Take 81 mg by mouth daily.    [provider]  carvedilol (COREG) 25 MG tablet Take 25 mg by mouth 2 (two) times daily with a meal.  10/06/12   [provider]  doxazosin (CARDURA) 4 MG tablet Take by mouth. 12/08/22   [provider]  olmesartan (BENICAR) 40 MG tablet Take 40 mg by mouth daily. 11/15/22   [provider]  spironolactone (ALDACTONE) 25 MG tablet Take 25 mg by mouth at bedtime.  05/15/16   [provider]     Family History  Problem Relation Age of Onset  Heart disease Mother    Heart disease Father    Heart disease Sister    Breast cancer Neg Hx    Prostate cancer Neg Hx    Colon cancer Neg Hx    Pancreatic cancer Neg Hx     Social History   Socioeconomic History   Marital status: Married    Spouse name: Not on file   Number of children: 2   Years of education: Not on file   Highest education level: Not on file  Occupational History   Not on file  Tobacco Use   Smoking status: Never   Smokeless tobacco: Never  Vaping Use   Vaping status: Never Used  Substance and Sexual Activity   Alcohol use: Yes     Comment: occasional   Drug use: Never   Sexual activity: Yes  Other Topics Concern   Not on file  Social History Narrative   Not on file   Social Drivers of Health   Financial Resource Strain: Not on file  Food Insecurity: Not on file  Transportation Needs: Not on file  Physical Activity: Not on file  Stress: Not on file  Social Connections: Not on file     Review of Systems: A 12 point ROS discussed and pertinent positives are indicated in the HPI above.  All other systems are negative.  Vital Signs: Ht 6' (1.829 m)   Wt 216 lb (98 kg)   BMI 29.29 kg/m    Physical Exam Vitals and nursing note reviewed.  Constitutional:      General: Patient is not in acute distress.    Appearance: Normal appearance. Patient is not ill-appearing.  HENT:     Head: Normocephalic and atraumatic.     Mouth/Throat:     Mouth: Mucous membranes are moist.     Pharynx: Oropharynx is clear.  Cardiovascular:     Rate and Rhythm: Normal rate and regular rhythm.     Pulses: Normal pulses.     Heart sounds: Normal heart sounds.  Pulmonary:     Effort: Pulmonary effort is normal.     Breath sounds: Normal breath sounds.  Abdominal:     General: Abdomen is flat. Bowel sounds are normal.     Palpations: Abdomen is soft.  Musculoskeletal:     Cervical back: Neck supple.  Skin:    General: Skin is warm and dry.     Coloration: Skin is not jaundiced or pale.  Neurological:     Mental Status: Patient is alert and oriented to person, place, and time.  Psychiatric:        Mood and Affect: Mood normal.        Behavior: Behavior normal.        Judgment: Judgment normal.    MD Evaluation Airway: WNL Heart: WNL Abdomen: WNL Chest/ Lungs: WNL ASA  Classification: 2 Mallampati/Airway Score: One  Imaging: MYOCARDIAL PERFUSION IMAGING Result Date: 02/05/2023   LV perfusion is normal. There is no evidence of ischemia. There is no evidence of infarction.  Diaphragm attenuation noted.   Left  ventricular function is normal. Nuclear stress EF: 51%. The left ventricular ejection fraction is mildly decreased (45-54%). End diastolic cavity size is normal.   The study is normal. The study is low risk.   DG Bone Survey Met Result Date: 01/26/2023 CLINICAL DATA:  Myeloma EXAM: METASTATIC BONE SURVEY COMPARISON:  Chest x-ray 06/26/2019 FINDINGS: Images of the axial and appendicular skeleton are performed. Calvarium:No lytic or sclerotic bone lesion  identified. Chest and Abdomen:No lytic or sclerotic bone lesion identified. Spine:Rounded areas of relative lucency within the C5 and C6 vertebral bodies are indeterminate. Lytic lesions at these locations is not excluded. No additional abnormalities are seen within the thoracic or lumbar spine. UPPER extremities:No lytic or sclerotic bone lesions identified. Pelvis and LOWER extremities:No lytic or sclerotic bone lesions identified. IMPRESSION: 1. Rounded areas of relative lucency within the C5 and C6 vertebral bodies are indeterminate. Lytic lesions at these locations is not excluded. Consider further evaluation with cervical spine MRI (preferred) or CT. 2. No additional lytic or sclerotic bone lesions are seen within the axial or appendicular skeleton. Electronically Signed   By: Mabel Converse D.O.   On: 01/26/2023 10:03    Labs:  CBC: Recent Labs    01/08/23 1150  WBC 6.8  HGB 14.6  HCT 43.4  PLT 179    COAGS: No results for input(s): INR, APTT in the last 8760 hours.  BMP: Recent Labs    01/08/23 1150  NA 140  K 4.3  CL 108  CO2 27  GLUCOSE 94  BUN 27*  CALCIUM  11.2*  CREATININE 1.48*  GFRNONAA 48*    LIVER FUNCTION TESTS: Recent Labs    01/08/23 1150  BILITOT 0.7  AST 20  ALT 27  ALKPHOS 54  PROT 7.4  ALBUMIN 4.4    TUMOR MARKERS: No results for input(s): AFPTM, CEA, CA199, CHROMGRNA in the last 8760 hours.  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   NPO since 4:30 AM VS pending  CBC pending  On aSA 81 mg no need for d./c KNDA  Risks and benefits of BMBx was discussed with the patient and/or patient's family including, but not limited to bleeding, infection, damage to adjacent structures or low yield requiring additional tests.  All of the questions were answered and there is agreement to proceed.  Consent signed and in chart.   Thank you for this interesting consult.  I greatly enjoyed meeting HERSEL MCMEEN and look forward to participating in their care.  A copy of this report was sent to the requesting provider on this date.  Electronically Signed: Toya VEAR Cousin, PA-C 02/08/2023, 9:35 AM   I spent a total of  30 Minutes   in face to face in clinical consultation, greater than 50% of which was counseling/coordinating care for BMBx.   This chart was dictated using voice recognition software.  Despite best efforts to proofread,  errors can occur which can change the documentation meaning.

## 2023-02-09 NOTE — Progress Notes (Signed)
 While stress test did not show any significant reduction in blood flow to the heart muscle, there is high amount of calcium  in your heart arteries. Recommend aspirin. Recommend starting Crestor  20 mg daily. Repeat lipid panel in 3 months.  Thanks MJP

## 2023-02-12 ENCOUNTER — Telehealth: Payer: Self-pay | Admitting: *Deleted

## 2023-02-12 DIAGNOSIS — Z79899 Other long term (current) drug therapy: Secondary | ICD-10-CM

## 2023-02-12 DIAGNOSIS — E782 Mixed hyperlipidemia: Secondary | ICD-10-CM

## 2023-02-12 MED ORDER — AMLODIPINE BESYLATE 10 MG PO TABS: 10.0000 mg | ORAL_TABLET | Freq: Every day | ORAL | 3 refills | Status: DC

## 2023-02-12 MED ORDER — ROSUVASTATIN CALCIUM 20 MG PO TABS: 20.0000 mg | ORAL_TABLET | Freq: Every day | ORAL | 2 refills | Status: AC

## 2023-02-12 NOTE — Telephone Encounter (Signed)
 The patient has been notified of the result and verbalized understanding.  All questions (if any) were answered.  Pt aware to start taking crestor  20 mg po daily and come in for repeat lipids in 3 months, which would be around the 2nd week of May.  He is aware that I will place the order for the 3 month lipids in the system and release for this time.  Advised him to go fasting to this lab appt.   Also advised the pt to start taking ASA 81 mg po daily.  Confirmed the pharmacy of choice with the pt.    Pt is noted to be on combo med amlodipine -atorvastatin.  He is aware to stop this medication, since we are starting him on crestor  20 mg daily.  Advised him that we will send in regular amlodipine  10 mg po daily to take in place of this medication.  Pt verbalized understanding and agrees with this plan.

## 2023-02-12 NOTE — Telephone Encounter (Signed)
-----   Message from [facility] sent at 02/09/2023  6:59 PM EST ----- While stress test did not show any significant reduction in blood flow to the heart muscle, there is high amount of calcium  in your heart arteries. Recommend aspirin. Recommend starting Crestor  20 mg daily. Repeat lipid panel in 3 months.  Thanks MJP

## 2023-02-13 ENCOUNTER — Telehealth: Payer: Self-pay | Admitting: Cardiology

## 2023-02-13 LAB — SURGICAL PATHOLOGY

## 2023-02-13 NOTE — Telephone Encounter (Signed)
Returned a phone call to the pt.  He just wanted to go over the med changes we discussed yesterday over the phone, in regards to his Ca Score results.  Reiterated to him that he was to stop his amlodipine-atorvastatin combo med, and we sent in for him to take crestor 20 mg po daily and amlodipine 10 mg po daily to take instead.   Pt states he picked those up today and he has all his medications right and in order.   Pt verbalized understanding and agrees with this plan.  Pt was more than gracious for all the assistance provide.

## 2023-02-13 NOTE — Telephone Encounter (Signed)
Patient would like a call back to review CT results again.

## 2023-02-15 ENCOUNTER — Ambulatory Visit (HOSPITAL_COMMUNITY): Payer: Medicare Other | Attending: Cardiology

## 2023-02-15 ENCOUNTER — Encounter: Payer: Self-pay | Admitting: Cardiology

## 2023-02-15 DIAGNOSIS — I4510 Unspecified right bundle-branch block: Secondary | ICD-10-CM | POA: Insufficient documentation

## 2023-02-15 DIAGNOSIS — R6889 Other general symptoms and signs: Secondary | ICD-10-CM | POA: Diagnosis present

## 2023-02-15 DIAGNOSIS — I444 Left anterior fascicular block: Secondary | ICD-10-CM | POA: Diagnosis present

## 2023-02-15 DIAGNOSIS — R9431 Abnormal electrocardiogram [ECG] [EKG]: Secondary | ICD-10-CM | POA: Insufficient documentation

## 2023-02-15 LAB — ECHOCARDIOGRAM COMPLETE
Area-P 1/2: 3.15 cm2
P 1/2 time: 872 ms
S' Lateral: 3.4 cm

## 2023-02-16 ENCOUNTER — Encounter (HOSPITAL_COMMUNITY): Payer: Self-pay

## 2023-02-19 ENCOUNTER — Ambulatory Visit (HOSPITAL_COMMUNITY)
Admission: RE | Admit: 2023-02-19 | Discharge: 2023-02-19 | Disposition: A | Discharge status: Home / Self Care | Payer: Medicare Other | Source: Ambulatory Visit

## 2023-02-19 DIAGNOSIS — M899 Disorder of bone, unspecified: Secondary | ICD-10-CM | POA: Diagnosis present

## 2023-02-19 MED ORDER — GADOBUTROL 1 MMOL/ML IV SOLN
10.0000 mL | Freq: Once | INTRAVENOUS | Status: AC | PRN
  Administered 2023-02-19: 10 mL via INTRAVENOUS

## 2023-02-20 ENCOUNTER — Encounter (HOSPITAL_COMMUNITY): Payer: Self-pay

## 2023-02-20 LAB — SURGICAL PATHOLOGY

## 2023-02-22 ENCOUNTER — Telehealth: Payer: Medicare Other

## 2023-02-22 NOTE — Assessment & Plan Note (Addendum)
 Kappa 4-5% in BM from 2025. Repeat labs in about 3 months, and follow up 2 weeks after blood draws.

## 2023-02-22 NOTE — Progress Notes (Signed)
 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 Patient location: Home Physician location: [facility]   He has low level M proteins reported once. Repeat testing reported IgG monoclonal protein with kappa light chain and kappa was elevated.  Borderline renal insufficiency and hypercalcemia.  Myeloma bone survey showed rounded areas of relative lucency within the C5 and C6 vertebral bodies are indeterminate. Lytic lesions at these locations is not excluded. Consider further evaluation with cervical spine MRI (preferred) or CT. Bone marrow was done and showed mildly hypercellular bone marrow with otherwise orderly trilineage hematopoiesis. Kappa predominant borderline plasmacytosis suggestive of a plasma cell neoplasm approximately 4 to 5%. FISH normal and negative for MM panel. MR C spine was done and reported heterogeneous bone marrow without focal destructive bone lesion.    Discussed the difference between MGUS, SMM versus active myeloma.  At this time, recommend continued following laboratory testing and history and physical exam.  More than 10 minutes of medical discussion on lab, bone marrow biopsy, imaging testing and follow-up plan with the patient today.  MGUS (monoclonal gammopathy of unknown significance) Assessment & Plan: Kappa 4-5% in BM from 2025. Repeat labs in about 3 months, and follow up 2 weeks after blood draws.  Orders: -     CBC with Differential (Cancer Center Only); Future -     CMP (Cancer Center only); Future -     Kappa/lambda light chains; Future -     IgG, IgA, IgM; Future -     Serum protein electrophoresis with reflex;  Future  Please schedule for follow-up.  Labs in about 2 weeks before visit with follow-up in about 3 months.  The patient understands the plans discussed today and is in agreement with them.  He knows to contact our office if he develops concerns prior to his next appointment.  Danny Sartorius, MD  [facility] - A DEPT OF [facility] [address] [place] [address] Dept: [phone] Dept Fax: [phone]   Orders Placed This Encounter  Procedures   CBC with Differential (Cancer Center Only)    Standing Status:   Future    Expiration Date:   02/26/2024   CMP (Cancer Center only)    Standing Status:   Future    Expiration Date:   02/26/2024   Kappa/lambda light chains    Standing Status:   Future    Expiration Date:   02/26/2024   IgG, IgA, IgM    Standing Status:   Future    Expiration Date:   02/26/2024   Serum protein electrophoresis with reflex    Standing Status:   Future    Expiration Date:   02/26/2024      CHIEF COMPLAINT:  CC: MGUS  INTERVAL HISTORY:  Danny Duran is being follow-up with telephone visit afternoon.   Imaging was completed and MRI did not show any destructive lytic lesion.  Bone marrow biopsy confirmed kappa restricted plasma cell and low percentage, consistent of MGUS.    Latest Reference Range & Units 01/08/23 11:50  Kappa free light chain 3.3 - 19.4 mg/L 120.7 (H)  Lambda free light  chains 5.7 - 26.3 mg/L 13.7  Kappa, lambda light chain ratio 0.26 - 1.65  8.81 (H)  (H): Data is abnormally high   ALLERGIES:  has no known allergies.  MEDICATIONS:  Current Outpatient Medications  Medication Sig Dispense Refill   allopurinol (ZYLOPRIM) 100 MG tablet Take 150 mg by mouth daily.     amLODipine (NORVASC) 10 MG tablet Take 1 tablet (10 mg total) by mouth daily. 90 tablet 3   aspirin EC 81 MG tablet Take 81 mg by mouth daily.     carvedilol (COREG) 25 MG tablet Take 25 mg by mouth 2  (two) times daily with a meal.      doxazosin (CARDURA) 4 MG tablet Take by mouth.     olmesartan (BENICAR) 40 MG tablet Take 40 mg by mouth daily.     rosuvastatin (CRESTOR) 20 MG tablet Take 1 tablet (20 mg total) by mouth daily. 90 tablet 2   spironolactone (ALDACTONE) 25 MG tablet Take 25 mg by mouth at bedtime.      No current facility-administered medications for this visit.    HISTORY OF PRESENT ILLNESS:   Past Medical History:  Diagnosis Date   Chronic gout    07-18-2019  per pt last episode left great toe approx. 11/ 2020   Hyperlipidemia    Hypertension    followed by pcp   (07-18-2019 per pt had one yrs ago, was told ok)   Nocturia    Prostate cancer [facility]) urologist--- dr Danny Duran/  oncology-- dr Danny Duran   dx 04/ 2021  Stage T1c, Gleason 3+4   RBBB (right bundle branch block)    Thrombocytopenia (HCC)    Wears glasses     REVIEW OF SYSTEMS:   All relevant systems were reviewed with the patient and are negative.   VITALS:  na  PHYSICAL EXAM:   na  LABORATORY DATA:  I have reviewed the data as listed    Component Value Date/Time   NA 140 01/08/2023 1150   K 4.3 01/08/2023 1150   CL 108 01/08/2023 1150   CO2 27 01/08/2023 1150   GLUCOSE 94 01/08/2023 1150   BUN 27 (H) 01/08/2023 1150   CREATININE 1.48 (H) 01/08/2023 1150   CALCIUM 11.2 (H) 01/08/2023 1150   PROT 7.4 01/08/2023 1150   ALBUMIN 4.4 01/08/2023 1150   AST 20 01/08/2023 1150   ALT 27 01/08/2023 1150   ALKPHOS 54 01/08/2023 1150   BILITOT 0.7 01/08/2023 1150   GFRNONAA 48 (L) 01/08/2023 1150   GFRAA 43 (L) 07/24/2019 0927    Lab Results  Component Value Date   SPEP . 01/08/2023    Lab Results  Component Value Date   WBC 5.6 02/08/2023   NEUTROABS 3.6 02/08/2023   HGB 13.5 02/08/2023   HCT 42.5 02/08/2023   MCV 93.6 02/08/2023   PLT 208 02/08/2023      Chemistry      Component Value Date/Time   NA 140 01/08/2023 1150   K 4.3 01/08/2023 1150   CL 108 01/08/2023 1150    CO2 27 01/08/2023 1150   BUN 27 (H) 01/08/2023 1150   CREATININE 1.48 (H) 01/08/2023 1150      Component Value Date/Time   CALCIUM 11.2 (H) 01/08/2023 1150   ALKPHOS 54 01/08/2023 1150   AST 20 01/08/2023 1150   ALT 27 01/08/2023 1150   BILITOT 0.7 01/08/2023 1150       RADIOGRAPHIC STUDIES: I have personally reviewed the radiological images as listed  and agreed with the findings in the report. MR CERVICAL SPINE W WO CONTRAST Result Date: 02/21/2023 CLINICAL DATA:  Rounded areas of relative lucency within the C5 and C6 vertebral bodies are indeterminate EXAM: MRI CERVICAL SPINE WITHOUT AND WITH CONTRAST TECHNIQUE: Multiplanar and multiecho pulse sequences of the cervical spine, to include the craniocervical junction and cervicothoracic junction, were obtained without and with intravenous contrast. CONTRAST:  10mL GADAVIST GADOBUTROL 1 MMOL/ML IV SOLN COMPARISON:  None Available. FINDINGS: Alignment: Straightening.  No substantial sagittal subluxation. Vertebrae: Heterogeneous bone marrow without focal destructive or enhancing lesion. Cord: Normal cord signal.  No abnormal enhancement. Posterior Fossa, vertebral arteries, paraspinal tissues: Negative. Disc levels: C2-C3: Facet and uncovertebral hypertrophy with mild left foraminal stenosis. Central disc protrusion without significant canal stenosis. C3-C4: Bilateral facet uncovertebral hypertrophy. Small posterior disc osteophyte complex. Mild bilateral foraminal stenosis. Mild canal stenosis. C4-C5: Posterior disc osteophyte complex with right greater than left facet and uncovertebral hypertrophy. Resulting moderate right foraminal stenosis. Mild canal stenosis. C5-C6: Posterior disc osteophyte complex with bilateral facet uncovertebral hypertrophy. Resulting moderate to severe bilateral foraminal stenosis. Mild canal stenosis. C6-C7: Bilateral facet and uncovertebral hypertrophy. Resulting moderate bilateral foraminal stenosis. Patent canal. C7-T1:  No significant disc protrusion, foraminal stenosis, or canal stenosis. IMPRESSION: 1. Heterogeneous bone marrow without focal destructive bone lesion. 2. At C5-C6, moderate to severe bilateral foraminal stenosis. Mild canal stenosis. 3. At C6-C7, moderate bilateral foraminal stenosis. 4. At C4-C5, moderate right foraminal stenosis. Mild canal stenosis. 5. At C3-C4, mild canal and bilateral foraminal stenosis. Electronically Signed   By: Feliberto Harts M.D.   On: 02/21/2023 04:28   CT CARDIAC SCORING (SELF PAY ONLY) Addendum Date: 02/18/2023 ADDENDUM REPORT: 02/18/2023 10:48 EXAM: OVER-READ INTERPRETATION CT CHEST The following report is an over-read performed by radiologist Dr. Romona Curls of [facility] on 02/18/2023. This over-read does not include interpretation of cardiac or coronary anatomy or pathology. The coronary calcium score interpretation by the cardiologist is attached. COMPARISON:  Chest radiograph dated 06/26/2019. FINDINGS: Cardiovascular: Vascular calcifications are seen in the thoracic aorta. The heart is enlarged. No pericardial effusion. Mediastinum/Nodes: No enlarged mediastinal lymph nodes. The visible trachea and esophagus demonstrate no significant findings. Lungs/Pleura: There is mild bronchiectasis and areas of bronchial plugging bilaterally. Mild scarring/atelectasis bilaterally. Upper Abdomen: No acute abnormality. Musculoskeletal: Degenerative changes are seen in the spine. IMPRESSION: 1. Mild bronchiectasis and areas of bronchial plugging bilaterally. 2. Cardiomegaly. Aortic Atherosclerosis (ICD10-I70.0). Electronically Signed   By: Romona Curls M.D.   On: 02/18/2023 10:48   Result Date: 02/18/2023 CLINICAL DATA:  Cardiovascular Disease Risk stratification EXAM: Coronary Calcium Score TECHNIQUE: A gated, non-contrast computed tomography scan of the heart was performed using 3mm slice thickness. Axial images were analyzed on a dedicated workstation. Calcium scoring  of the coronary arteries was performed using the Agatston method. FINDINGS: Coronary Calcium Score: Left main: 10.5 Left anterior descending artery: 1187 Left circumflex artery: 119 Right coronary artery: 312 Total: 1628 Percentile: 94th Pericardium: Moderate pericardial effusion. 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 Aortic atherosclerosis. IMPRESSION: 1. Coronary calcium score of 1628. This was 94th percentile for age-, race-, and sex-matched controls. 2.  Aortic atherosclerosis. 3. 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 Recommend annual imaging followup by CTA or MRA. This recommendation follows 2010 ACCF/AHA/AATS/ACR/ASA/SCA/SCAI/SIR/STS/SVM Guidelines for the Diagnosis and Management of Patients with Thoracic Aortic Disease. Circulation. 2010; 121: G956-O130. 4. Moderate pericardial effusion, consider echocardiogram to further evaluate the severity and clinical significance.  5. Non-cardiac: See separate report from [facility]. RECOMMENDATIONS: Coronary artery calcium (CAC) score is a strong predictor of incident coronary heart disease (CHD) and provides predictive information beyond traditional risk factors. CAC scoring is reasonable to use in the decision to withhold, postpone, or initiate statin therapy in intermediate-risk or selected borderline-risk asymptomatic adults (age 45-75 years and LDL-C >=70 to <190 mg/dL) who do not have diabetes or established atherosclerotic cardiovascular disease (ASCVD).* In intermediate-risk (10-year ASCVD risk >=7.5% to <20%) adults or selected borderline-risk (10-year ASCVD risk >=5% to <7.5%) adults in whom a CAC score is measured for the purpose of making a treatment decision the following recommendations have been made: If CAC=0, it is reasonable to withhold statin therapy and reassess in 5 to 10 years, as long as higher risk conditions are absent (diabetes mellitus, family  history of premature CHD in first degree relatives (males <55 years; females <65 years), cigarette smoking, or LDL >=190 mg/dL). If CAC is 1 to 99, it is reasonable to initiate statin therapy for patients >=62 years of age. If CAC is >=100 or >=75th percentile, it is reasonable to initiate statin therapy at any age. Cardiology referral should be considered for patients with CAC scores >=400 or >=75th percentile. *2018 AHA/ACC/AACVPR/AAPA/ABC/ACPM/ADA/AGS/APhA/ASPC/NLA/PCNA Guideline on the Management of Blood Cholesterol: A Report of the American College of Cardiology/American Heart Association Task Force on Clinical Practice Guidelines. J Am Coll Cardiol. 2019;73(24):3168-3209. Tessa Lerner, DO, [facility] Electronically Signed: By: Tessa Lerner D.O. On: 02/08/2023 19:36   ECHOCARDIOGRAM COMPLETE Result Date: 02/15/2023    ECHOCARDIOGRAM REPORT   Patient Name:   TYREAK REAGLE Date of Exam: 02/15/2023 Medical Rec #:  [id number]      Height:       72.0 in Accession #:    [id number]     Weight:       216.0 lb Date of Birth:  1945/10/17      BSA:          2.201 m Patient Age:    77 years       BP:           124/64 mmHg Patient Gender: M              HR:           52 bpm. Exam Location:  [address] Procedure: 2D Echo, 3D Echo, Cardiac Doppler, Color Doppler and Strain Analysis            (Both Spectral and Color Flow Doppler were utilized during            procedure). Indications:    R94.31 Abnormal EKG  History:        Patient has no prior history of Echocardiogram examinations.                 Risk Factors:Hypertension and Dyslipidemia.  Sonographer:    Daphine Deutscher RDCS Referring Phys: [id number] [facility] PATWARDHAN IMPRESSIONS  1. Left ventricular ejection fraction, by estimation, is 55 to 60%. Left ventricular ejection fraction by 3D volume is 58 %. The left ventricle has normal function. The left ventricle has no regional wall motion abnormalities. Left ventricular diastolic  parameters are consistent with  Grade I diastolic dysfunction (impaired relaxation). The average left ventricular global longitudinal strain is -23.2 %. The global longitudinal strain is normal.  2. Right ventricular systolic function is normal. The right ventricular size is severely enlarged. A Very thickened moderator band is visualized.  3. The mitral valve  is normal in structure. Trivial mitral valve regurgitation. No evidence of mitral stenosis.  4. The aortic valve is normal in structure. Aortic valve regurgitation is mild. No aortic stenosis is present. Aortic regurgitation PHT measures 872 msec.  5. 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  6. The inferior vena cava is normal in size with greater than 50% respiratory variability, suggesting right atrial pressure of 3 mmHg.  7. Ascending aorta measurements are within normal limits for age when indexed to body surface area. FINDINGS  Left Ventricle: Left ventricular ejection fraction, by estimation, is 55 to 60%. Left ventricular ejection fraction by 3D volume is 58 %. The left ventricle has normal function. The left ventricle has no regional wall motion abnormalities. The average left ventricular global longitudinal strain is -23.2 %. Strain was performed and the global longitudinal strain is normal. The left ventricular internal cavity size was normal in size. There is no left ventricular hypertrophy. Left ventricular diastolic parameters are consistent with Grade I diastolic dysfunction (impaired relaxation). Normal left ventricular filling pressure. Right Ventricle: The right ventricular size is severely enlarged. No increase in right ventricular wall thickness. Right ventricular systolic function is normal. Left Atrium: Left atrial size was normal in size. Right Atrium: Right atrial size was normal in size. Pericardium: There is no evidence of pericardial effusion. Mitral Valve: The mitral valve is normal in structure. Trivial mitral valve regurgitation. No evidence of mitral valve  stenosis. Tricuspid Valve: The tricuspid valve is normal in structure. Tricuspid valve regurgitation is mild . No evidence of tricuspid stenosis. Aortic Valve: The aortic valve is normal in structure. Aortic valve regurgitation is mild. Aortic regurgitation PHT measures 872 msec. No aortic stenosis is present. Pulmonic Valve: The pulmonic valve was normal in structure. 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 No evidence of pulmonic stenosis. Aorta: The aortic root is normal in size and structure. Ascending aorta measurements are within normal limits for age when indexed to body surface area. Venous: The inferior vena cava is normal in size with greater than 50% respiratory variability, suggesting right atrial pressure of 3 mmHg. IAS/Shunts: No atrial level shunt detected by color flow Doppler. Additional Comments: 3D was performed not requiring image post processing on an independent workstation and was normal. A Very thickened moderator band is visualized.  LEFT VENTRICLE PLAX 2D LVIDd:         4.90 cm         Diastology LVIDs:         3.40 cm         LV e' medial:    5.98 cm/s LV PW:         1.00 cm         LV E/e' medial:  10.7 LV IVS:        1.00 cm         LV e' lateral:   13.70 cm/s LVOT diam:     2.10 cm         LV E/e' lateral: 4.6 LV SV:         70 LV SV Index:   32              2D LVOT Area:     3.46 cm        Longitudinal                                Strain  2D Strain GLS  -23.5 %                                (A2C):                                2D Strain GLS  -23.7 %                                (A3C):                                2D Strain GLS  -22.3 %                                (A4C):                                2D Strain GLS  -23.2 %                                Avg:                                 3D Volume EF                                LV 3D EF:    Left                                             ventricul                                              ar                                             ejection                                             fraction                                             by 3D                                             volume is  58 %.                                 3D Volume EF:                                3D EF:        58 %                                LV EDV:       160 ml                                LV ESV:       67 ml                                LV SV:        94 ml RIGHT VENTRICLE             IVC RV Basal diam:  5.80 cm     IVC diam: 1.00 cm RV S prime:     12.55 cm/s LEFT ATRIUM             Index        RIGHT ATRIUM           Index LA diam:        3.60 cm 1.64 cm/m   RA Area:     18.80 cm LA Vol (A2C):   40.3 ml 18.31 ml/m  RA Volume:   62.20 ml  28.25 ml/m LA Vol (A4C):   31.8 ml 14.45 ml/m LA Biplane Vol: 38.9 ml 17.67 ml/m  AORTIC VALVE LVOT Vmax:   81.50 cm/s LVOT Vmean:  49.800 cm/s LVOT VTI:    0.201 m AI PHT:      872 msec  AORTA Ao Root diam: 3.80 cm Ao Asc diam:  3.90 cm MITRAL VALVE               TRICUSPID VALVE MV Area (PHT): 3.15 cm    TR Peak grad:   35.5 mmHg MV Decel Time: 241 msec    TR Vmax:        298.00 cm/s MV E velocity: 63.70 cm/s MV A velocity: 82.55 cm/s  SHUNTS MV E/A ratio:  0.77        Systemic VTI:  0.20 m                            Systemic Diam: 2.10 cm Armanda Magic MD Electronically signed by Armanda Magic MD Signature Date/Time: 02/15/2023/4:21:06 PM    Final    CT BONE MARROW BIOPSY & ASPIRATION Result Date: 02/09/2023 CLINICAL DATA:  Monoclonal gammopathy and elevated kappa free light chains. Need for bone marrow biopsy. EXAM: CT GUIDED BONE MARROW ASPIRATION AND BIOPSY ANESTHESIA/SEDATION: Moderate (conscious) sedation was employed during this procedure. A total of Versed 4.0 mg and Fentanyl 100 mcg was administered intravenously. Moderate Sedation Time: 10 minutes. The patient's level of consciousness and vital signs were  monitored continuously by radiology nursing throughout the procedure under my direct supervision. PROCEDURE: The procedure risks, benefits, and alternatives were explained  to the patient. Questions regarding the procedure were encouraged and answered. The patient understands and consents to the procedure. A time out was performed prior to initiating the procedure. The right gluteal region was prepped with chlorhexidine. Sterile gown and sterile gloves were used for the procedure. Local anesthesia was provided with 1% Lidocaine. Under CT guidance, an 11 gauge On Control bone cutting needle was advanced from a posterior approach into the right iliac bone. Needle positioning was confirmed with CT. Initial non heparinized and heparinized aspirate samples were obtained of bone marrow. Core biopsy was performed via the On Control drill needle. COMPLICATIONS: None FINDINGS: Inspection of initial aspirate did reveal visible particles. Intact core biopsy sample was obtained. IMPRESSION: CT guided bone marrow biopsy of right posterior iliac bone with both aspirate and core samples obtained. Electronically Signed   By: Irish Lack M.D.   On: 02/09/2023 08:02   MYOCARDIAL PERFUSION IMAGING Result Date: 02/05/2023   LV perfusion is normal. There is no evidence of ischemia. There is no evidence of infarction.  Diaphragm attenuation noted.   Left ventricular function is normal. Nuclear stress EF: 51%. The left ventricular ejection fraction is mildly decreased (45-54%). End diastolic cavity size is normal.   The study is normal. The study is low risk.

## 2023-02-26 ENCOUNTER — Telehealth: Payer: Self-pay

## 2023-02-26 ENCOUNTER — Inpatient Hospital Stay: Payer: Medicare Other

## 2023-02-26 DIAGNOSIS — D472 Monoclonal gammopathy: Secondary | ICD-10-CM

## 2023-02-26 NOTE — Telephone Encounter (Signed)
 Patient Scheduled appts. Patient is aware of all appt details.

## 2023-04-13 ENCOUNTER — Ambulatory Visit: Payer: Medicare Other | Attending: Cardiology | Admitting: Cardiology

## 2023-04-13 ENCOUNTER — Encounter: Payer: Self-pay | Admitting: Cardiology

## 2023-04-13 VITALS — BP 124/70 | HR 86 | Resp 16 | Ht 72.0 in | Wt 206.0 lb

## 2023-04-13 DIAGNOSIS — I10 Essential (primary) hypertension: Secondary | ICD-10-CM | POA: Diagnosis not present

## 2023-04-13 DIAGNOSIS — E782 Mixed hyperlipidemia: Secondary | ICD-10-CM | POA: Diagnosis not present

## 2023-04-13 DIAGNOSIS — I2510 Atherosclerotic heart disease of native coronary artery without angina pectoris: Secondary | ICD-10-CM

## 2023-04-13 NOTE — Patient Instructions (Signed)
 Lab Work: LIPID PANEL   If you have labs (blood work) drawn today and your tests are completely normal, you will receive your results only by: MyChart Message (if you have MyChart) OR A paper copy in the mail If you have any lab test that is abnormal or we need to change your treatment, we will call you to review the results.  Follow-Up: At [facility], you and your health needs are our priority.  As part of our continuing mission to provide you with exceptional heart care, our providers are all part of one team.  This team includes your primary Cardiologist (physician) and Advanced Practice Providers or APPs (Physician Assistants and Nurse Practitioners) who all work together to provide you with the care you need, when you need it.  Your next appointment:   6 month(s)  Provider:   Elder Negus, MD     We recommend signing up for the patient portal called "MyChart".  Sign up information is provided on this After Visit Summary.  MyChart is used to connect with patients for Virtual Visits (Telemedicine).  Patients are able to view lab/test results, encounter notes, upcoming appointments, etc.  Non-urgent messages can be sent to your provider as well.   To learn more about what you can do with MyChart, go to [email or web address].   Other Instructions       1st Floor: - Lobby - Registration  - Pharmacy  - Lab - Cafe  2nd Floor: - PV Lab - Diagnostic Testing (echo, CT, nuclear med)  3rd Floor: - Vacant  4th Floor: - TCTS (cardiothoracic surgery) - AFib Clinic - Structural Heart Clinic - Vascular Surgery  - Vascular Ultrasound  5th Floor: - [facility] (general and EP) - Clinical Pharmacy for coumadin, hypertension, lipid, weight-loss medications, and med management appointments    Valet parking services will be available as well.

## 2023-04-13 NOTE — Progress Notes (Signed)
 Cardiology Office Note:  .   Date:  04/13/2023  ID:  Danny Duran, DOB Nov 06, 1945, MRN [id number] PCP: Danny Ferretti, DO  [facility] Providers Cardiologist:  Danny Mainland, MD PCP: Danny Ferretti, DO  Chief Complaint  Patient presents with   Hypertension   Follow-up    3 month      History of Present Illness: .    Danny Duran is a 78 y.o. male with hypertension, hyperlipidemia, CAD  Patient denies any specific chest pain symptoms at this time.  He is able to walk 2 miles and 55 minutes without much exertional dyspnea.  Reviewed results of recent CT scan, with the patient and wife, details below.     Vitals:   04/13/23 1031  BP: 124/70  Pulse: 86  Resp: 16  SpO2: 96%      ROS:  Review of Systems  Constitutional: Positive for malaise/fatigue.  Cardiovascular:  Negative for chest pain, dyspnea on exertion, leg swelling, palpitations and syncope.     Studies Reviewed: Marland Kitchen        EKG 01/26/2023: Sinus bradycardia Right bundle branch block Left anterior fascicular block Bifascicular block Minimal voltage criteria for LVH, may be normal variant ( R in aVL ) When compared with ECG of 26-Jun-2019 11:02, No significant change since    Independently interpreted 01/2023: Hb 14.6 Cr 1.48 TSH 1.9  05/2022: Chol 170, TG 111, HDL 34, LDL 114 HbA1C 5.7% Hb 14.6  CT cardiac scoring 02/2023: Left main: 10.5  Left anterior descending artery: 1187 Left circumflex artery: 119 Right coronary artery: 312   Total: 1628 Percentile: 94th   Pericardium: Moderate pericardial effusion. 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 Aortic atherosclerosis.   IMPRESSION: 1. Coronary calcium score of 1628. This was 94th percentile for age-, race-, and sex-matched controls.   2.  Aortic atherosclerosis.   3. 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 Mild bronchiectasis and areas of bronchial plugging  bilaterally. 2. Cardiomegaly.  Stress test 02/2023:   LV perfusion is normal. There is no evidence of ischemia. There is no evidence of infarction.  Diaphragm attenuation noted.   Left ventricular function is normal. Nuclear stress EF: 51%. The left ventricular ejection fraction is mildly decreased (45-54%). End diastolic cavity size is normal.   The study is normal. The study is low risk.   Echocardiogram 02/2023:  1. Left ventricular ejection fraction, by estimation, is 55 to 60%. Left  ventricular ejection fraction by 3D volume is 58 %. The left ventricle has  normal function. The left ventricle has no regional wall motion  abnormalities. Left ventricular diastolic   parameters are consistent with Grade I diastolic dysfunction (impaired  relaxation). The average left ventricular global longitudinal strain is  -23.2 %. The global longitudinal strain is normal.   2. Right ventricular systolic function is normal. The right ventricular  size is severely enlarged. A Very thickened moderator band is visualized.   3. The mitral valve is normal in structure. Trivial mitral valve  regurgitation. No evidence of mitral stenosis.   4. The aortic valve is normal in structure. Aortic valve regurgitation is  mild. No aortic stenosis is present. Aortic regurgitation PHT measures 872  msec.   5. 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   6. The inferior vena cava is normal in size with greater than 50%  respiratory variability, suggesting right atrial pressure of 3 mmHg.   7. Ascending aorta measurements are within normal  limits for age when  indexed to body surface area.  There is no evidence of pericardial effusion.    Physical Exam:   Physical Exam Vitals and nursing note reviewed.  Constitutional:      General: He is not in acute distress. Neck:     Vascular: No JVD.  Cardiovascular:     Rate and Rhythm: Normal rate and regular rhythm.     Heart sounds: Normal heart sounds. No murmur  heard. Pulmonary:     Effort: Pulmonary effort is normal.     Breath sounds: Normal breath sounds. No wheezing or rales.      VISIT DIAGNOSES:   ICD-10-CM   1. Coronary artery disease involving native coronary artery of native heart without angina pectoris  I25.10 Lipid panel    2. Mixed hyperlipidemia  E78.2 Lipid panel        ASSESSMENT AND PLAN: .    Danny Duran is a 78 y.o. male with hypertension, hyperlipidemia, CAD  CAD: Multivessel coronary calcium noted on CT scan, with 94th percentile calcium score. No ischemia on stress testing. No clear anginal symptoms at this time. Recommend aggressive medical management. Continue aspirin 81 mg daily, Crestor 10 mg daily. Check lipid panel, goal LDL below 70.  Hypertension: Well controlled.  Mixed hyperlipidemia: As above.  CT scan noted moderate pericardial effusion, but this was not seen on more specific testing of echocardiogram.  I do not think this needs any further follow-up at this time.  Also, ascending aorta 41 mm, noted to be appropriate for height and weight.  I do not think this is true ascending aorta aneurysm.  F/u in 6 months  Signed, Elder Negus, MD

## 2023-04-19 LAB — LIPID PANEL
Chol/HDL Ratio: 3.6 ratio (ref 0.0–5.0)
Cholesterol, Total: 148 mg/dL (ref 100–199)
HDL: 41 mg/dL (ref >39)
LDL Chol Calc (NIH): 71 mg/dL (ref 0–99)
Triglycerides: 217 mg/dL — ABNORMAL HIGH (ref 0–149)
VLDL Cholesterol Cal: 36 mg/dL (ref 5–40)

## 2023-04-20 ENCOUNTER — Encounter: Payer: Self-pay | Admitting: Cardiology

## 2023-05-15 ENCOUNTER — Inpatient Hospital Stay

## 2023-05-15 ENCOUNTER — Inpatient Hospital Stay: Payer: Medicare Other

## 2023-05-15 DIAGNOSIS — Z8546 Personal history of malignant neoplasm of prostate: Secondary | ICD-10-CM | POA: Insufficient documentation

## 2023-05-15 DIAGNOSIS — N1831 Chronic kidney disease, stage 3a: Secondary | ICD-10-CM | POA: Diagnosis not present

## 2023-05-15 DIAGNOSIS — D472 Monoclonal gammopathy: Secondary | ICD-10-CM | POA: Insufficient documentation

## 2023-05-15 LAB — CBC WITH DIFFERENTIAL (CANCER CENTER ONLY)
Abs Immature Granulocytes: 0.01 10*3/uL (ref 0.00–0.07)
Basophils Absolute: 0 10*3/uL (ref 0.0–0.1)
Basophils Relative: 1 %
Eosinophils Absolute: 0.1 10*3/uL (ref 0.0–0.5)
Eosinophils Relative: 2 %
HCT: 41 % (ref 39.0–52.0)
Hemoglobin: 13.8 g/dL (ref 13.0–17.0)
Immature Granulocytes: 0 %
Lymphocytes Relative: 21 %
Lymphs Abs: 1.1 10*3/uL (ref 0.7–4.0)
MCH: 30.6 pg (ref 26.0–34.0)
MCHC: 33.7 g/dL (ref 30.0–36.0)
MCV: 90.9 fL (ref 80.0–100.0)
Monocytes Absolute: 0.4 10*3/uL (ref 0.1–1.0)
Monocytes Relative: 8 %
Neutro Abs: 3.8 10*3/uL (ref 1.7–7.7)
Neutrophils Relative %: 68 %
Platelet Count: 170 10*3/uL (ref 150–400)
RBC: 4.51 MIL/uL (ref 4.22–5.81)
RDW: 14.6 % (ref 11.5–15.5)
WBC Count: 5.6 10*3/uL (ref 4.0–10.5)
nRBC: 0 % (ref 0.0–0.2)

## 2023-05-15 LAB — CMP (CANCER CENTER ONLY)
ALT: 34 U/L (ref 0–44)
AST: 22 U/L (ref 15–41)
Albumin: 4.4 g/dL (ref 3.5–5.0)
Alkaline Phosphatase: 55 U/L (ref 38–126)
Anion gap: 4 — ABNORMAL LOW (ref 5–15)
BUN: 25 mg/dL — ABNORMAL HIGH (ref 8–23)
CO2: 28 mmol/L (ref 22–32)
Calcium: 10.8 mg/dL — ABNORMAL HIGH (ref 8.9–10.3)
Chloride: 108 mmol/L (ref 98–111)
Creatinine: 1.45 mg/dL — ABNORMAL HIGH (ref 0.61–1.24)
GFR, Estimated: 50 mL/min — ABNORMAL LOW (ref >60)
Glucose, Bld: 101 mg/dL — ABNORMAL HIGH (ref 70–99)
Potassium: 4.4 mmol/L (ref 3.5–5.1)
Sodium: 140 mmol/L (ref 135–145)
Total Bilirubin: 0.7 mg/dL (ref 0.0–1.2)
Total Protein: 7.3 g/dL (ref 6.5–8.1)

## 2023-05-16 LAB — IGG, IGA, IGM
IgA: 120 mg/dL (ref 61–437)
IgG (Immunoglobin G), Serum: 1217 mg/dL (ref 603–1613)
IgM (Immunoglobulin M), Srm: 15 mg/dL (ref 15–143)

## 2023-05-16 LAB — KAPPA/LAMBDA LIGHT CHAINS
Kappa free light chain: 113.5 mg/L — ABNORMAL HIGH (ref 3.3–19.4)
Kappa, lambda light chain ratio: 7.47 — ABNORMAL HIGH (ref 0.26–1.65)
Lambda free light chains: 15.2 mg/L (ref 5.7–26.3)

## 2023-05-23 LAB — PROTEIN ELECTROPHORESIS, SERUM, WITH REFLEX
A/G Ratio: 1.3 (ref 0.7–1.7)
Albumin ELP: 3.9 g/dL (ref 2.9–4.4)
Alpha-1-Globulin: 0.2 g/dL (ref 0.0–0.4)
Alpha-2-Globulin: 0.7 g/dL (ref 0.4–1.0)
Beta Globulin: 1 g/dL (ref 0.7–1.3)
Gamma Globulin: 1.1 g/dL (ref 0.4–1.8)
Globulin, Total: 3.1 g/dL (ref 2.2–3.9)
M-Spike, %: 0.4 g/dL — ABNORMAL HIGH
SPEP Interpretation: 0
Total Protein ELP: 7 g/dL (ref 6.0–8.5)

## 2023-05-23 LAB — IMMUNOFIXATION REFLEX, SERUM
IgA: 121 mg/dL (ref 61–437)
IgG (Immunoglobin G), Serum: 1138 mg/dL (ref 603–1613)
IgM (Immunoglobulin M), Srm: 13 mg/dL — ABNORMAL LOW (ref 15–143)

## 2023-05-24 ENCOUNTER — Ambulatory Visit: Payer: Self-pay

## 2023-05-25 NOTE — Progress Notes (Unsigned)
 [facility] OFFICE PROGRESS NOTE  Patient Care Team: Windell Hasty, DO as PCP - General (Internal Medicine) Cody Das, MD as PCP - Cardiology (Cardiology)  Danny Duran is a 78 y.o.male with history of HTN, HLD, gout, prostate cancer T1c (3+4)GG2 s/p brachytherapy [facility].   He has low level M proteins reported once. Repeat testing reported IgG monoclonal protein with kappa light chain and kappa was elevated.  Borderline renal insufficiency and hypercalcemia.  Myeloma bone survey showed rounded areas of relative lucency within the C5 and C6 vertebral bodies are indeterminate. Lytic lesions at these locations is not excluded. MR C spine was done and reported heterogeneous bone marrow without focal destructive bone lesion.    Bone marrow was done and showed mildly hypercellular bone marrow with otherwise orderly trilineage hematopoiesis. Kappa predominant borderline plasmacytosis suggestive of a plasma cell neoplasm approximately 4 to 5%. FISH normal and negative for MM panel.   Repeat labs in May showed stable mild renal insufficiency. M proteins of 0.4. kappa of 113 with last one of 120. Ratio of 7.4 from 8.8. normal Hgb. Corrected Ca 10.5.  Discussed results today. Recommend adequate fluid at home. He understands. Follow up in about 6 months, earlier if needed. Discuss if unable to urinating, report to ED for evaluation.  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 Stable  Repeat labs in about 6 months, and follow up 2 weeks after blood draws. Stage 3a chronic kidney disease (HCC) Stable Fluid goal 60-70 oz per day  Orders Placed This Encounter  Procedures   CBC with Differential (Cancer Center Only)    Standing Status:   Future    Expiration Date:   05/28/2024   CMP (Cancer Center only)    Standing Status:   Future    Expiration Date:   05/28/2024   IgG, IgA, IgM     Standing Status:   Future    Expiration Date:   05/28/2024   Kappa/lambda light chains    Standing Status:   Future    Expiration Date:   05/28/2024   Serum protein electrophoresis with reflex    Standing Status:   Future    Expiration Date:   05/28/2024     Lowanda Ruddy, MD  INTERVAL HISTORY: Patient returns for follow-up. Labs are stable. Appetite is good. No unexpected weight loss, nausea or vomiting, new back pain or bone. Report previous right knee pain and back pain for years. No trouble urinating.   Latest Reference Range & Units 05/15/23 10:52  IgG (Immunoglobin G), Serum 603 - 1,613 mg/dL 3,086  IgM (Immunoglobulin M), Srm 15 - 143 mg/dL 15  IgA 61 - 578 mg/dL 469  Kappa free light chain 3.3 - 19.4 mg/L 113.5 (H)  Lambda free light chains 5.7 - 26.3 mg/L 15.2  Kappa, lambda light chain ratio 0.26 - 1.65  7.47 (H)  (H): Data is abnormally high  Oncology History  Malignant neoplasm of prostate (HCC)  04/07/2019 Cancer Staging   Staging form: Prostate, AJCC 8th Edition - Clinical stage from 04/07/2019: Stage IIB (cT1c, cN0, cM0, PSA: 5.6, Grade Group: 2) - Signed by Keitha Pata, PA-C on 05/27/2019   05/27/2019 Initial Diagnosis   Malignant neoplasm of prostate (HCC)      PHYSICAL EXAMINATION: ECOG PERFORMANCE STATUS: 1 - Symptomatic but completely ambulatory  Vitals:   05/29/23 1015  BP: 127/70  Pulse: 60  Resp:  20  Temp: (!) 97.5 F (36.4 C)  SpO2: 99%   Filed Weights   05/29/23 1015  Weight: 208 lb 9.6 oz (94.6 kg)    Relevant data reviewed during this visit included labs.

## 2023-05-29 ENCOUNTER — Inpatient Hospital Stay

## 2023-05-29 ENCOUNTER — Ambulatory Visit: Payer: Medicare Other

## 2023-05-29 VITALS — BP 127/70 | HR 60 | Temp 97.5°F | Resp 20 | Wt 208.6 lb

## 2023-05-29 DIAGNOSIS — N1831 Chronic kidney disease, stage 3a: Secondary | ICD-10-CM

## 2023-05-29 DIAGNOSIS — D472 Monoclonal gammopathy: Secondary | ICD-10-CM | POA: Diagnosis not present

## 2023-05-29 NOTE — Assessment & Plan Note (Addendum)
 Kappa 4-5% in BM from 2025. Stable  Repeat labs in about 6 months, and follow up 2 weeks after blood draws.

## 2023-10-11 ENCOUNTER — Ambulatory Visit

## 2023-10-11 ENCOUNTER — Ambulatory Visit: Attending: Nurse Practitioner | Admitting: Nurse Practitioner

## 2023-10-11 ENCOUNTER — Encounter: Payer: Self-pay | Admitting: Nurse Practitioner

## 2023-10-11 ENCOUNTER — Telehealth: Payer: Self-pay | Admitting: Cardiology

## 2023-10-11 VITALS — BP 126/74 | HR 67 | Resp 16 | Ht 71.0 in | Wt 206.0 lb

## 2023-10-11 DIAGNOSIS — I4510 Unspecified right bundle-branch block: Secondary | ICD-10-CM

## 2023-10-11 DIAGNOSIS — E785 Hyperlipidemia, unspecified: Secondary | ICD-10-CM

## 2023-10-11 DIAGNOSIS — R001 Bradycardia, unspecified: Secondary | ICD-10-CM

## 2023-10-11 DIAGNOSIS — I10 Essential (primary) hypertension: Secondary | ICD-10-CM

## 2023-10-11 DIAGNOSIS — I351 Nonrheumatic aortic (valve) insufficiency: Secondary | ICD-10-CM

## 2023-10-11 DIAGNOSIS — I2510 Atherosclerotic heart disease of native coronary artery without angina pectoris: Secondary | ICD-10-CM | POA: Diagnosis not present

## 2023-10-11 NOTE — Telephone Encounter (Signed)
 Caller Lundy) returned staff call and stated they only have patient's last BP and HR readings and do not have patient's EKG reading.  Caller stated she will be faxing over those results.

## 2023-10-11 NOTE — Progress Notes (Signed)
 Office Visit    Patient Name: Danny Duran Date of Encounter: 10/11/2023  Primary Care Provider:  Valentin Skates, DO Primary Cardiologist:  Newman JINNY Lawrence, MD  Chief Complaint    78 year old male with a history of nonobstructive CAD (coronary calcium  score 1628, 94th percentile), aortic atherosclerosis, mild dilation of the ascending aorta, aortic valve regurgitation, RBBB, hypertension, hyperlipidemia, gout, and prostate cancer who presents for follow-up related to PVCs, possible bradycardia.  Past Medical History    Past Medical History:  Diagnosis Date   Chronic gout    07-18-2019  per pt last episode left great toe approx. 11/ 2020   Hyperlipidemia    Hypertension    followed by pcp   (07-18-2019 per pt had one yrs ago, was told ok)   Nocturia    Prostate cancer [facility]) urologist--- dr dahlstedt/  oncology-- dr patrcia   dx 04/ 2021  Stage T1c, Gleason 3+4   RBBB (right bundle branch block)    Thrombocytopenia    Wears glasses    Past Surgical History:  Procedure Laterality Date   CYSTOSCOPY N/A 07/28/2019   Procedure: CYSTOSCOPY FLEXIBLE;  Surgeon: Matilda Senior, MD;  Location: [facility];  Service: Urology;  Laterality: N/A;   NO PAST SURGERIES     PROSTATE BIOPSY     RADIOACTIVE SEED IMPLANT N/A 07/28/2019   Procedure: RADIOACTIVE SEED IMPLANT/BRACHYTHERAPY IMPLANT;  Surgeon: Matilda Senior, MD;  Location: [facility];  Service: Urology;  Laterality: N/A;  90 MINS   SPACE OAR INSTILLATION N/A 07/28/2019   Procedure: SPACE OAR INSTILLATION;  Surgeon: Matilda Senior, MD;  Location: [facility];  Service: Urology;  Laterality: N/A;    Allergies  Allergies  Allergen Reactions   Diltiazem     Other Reaction(s): Unknown   Pravastatin     Other Reaction(s): cough, irregular heart beat 1996     Labs/Other Studies Reviewed    The following studies were reviewed today:  Cardiac Studies & Procedures    ______________________________________________________________________________________________   STRESS TESTS  MYOCARDIAL PERFUSION IMAGING 02/05/2023  Interpretation Summary   LV perfusion is normal. There is no evidence of ischemia. There is no evidence of infarction.  Diaphragm attenuation noted.   Left ventricular function is normal. Nuclear stress EF: 51%. The left ventricular ejection fraction is mildly decreased (45-54%). End diastolic cavity size is normal.   The study is normal. The study is low risk.   ECHOCARDIOGRAM  ECHOCARDIOGRAM COMPLETE 02/15/2023  Narrative ECHOCARDIOGRAM REPORT    Patient Name:   Danny Duran Date of Exam: 02/15/2023 Medical Rec #:  [id number]      Height:       72.0 in Accession #:    [id number]     Weight:       216.0 lb Date of Birth:  Jan 14, 1945      BSA:          2.201 m Patient Age:    77 years       BP:           124/64 mmHg Patient Gender: M              HR:           52 bpm. Exam Location:  [address]  Procedure: 2D Echo, 3D Echo, Cardiac Doppler, Color Doppler and Strain Analysis (Both Spectral and Color Flow Doppler were utilized during procedure).  Indications:    R94.31 Abnormal EKG  History:  Patient has no prior history of Echocardiogram examinations. Risk Factors:Hypertension and Dyslipidemia.  Sonographer:    Carl Coma RDCS Referring Phys: [id number] [facility] J PATWARDHAN  IMPRESSIONS   1. Left ventricular ejection fraction, by estimation, is 55 to 60%. Left ventricular ejection fraction by 3D volume is 58 %. The left ventricle has normal function. The left ventricle has no regional wall motion abnormalities. Left ventricular diastolic parameters are consistent with Grade I diastolic dysfunction (impaired relaxation). The average left ventricular global longitudinal strain is -23.2 %. The global longitudinal strain is normal. 2. Right ventricular systolic function is normal. The right ventricular size is  severely enlarged. A Very thickened moderator band is visualized. 3. The mitral valve is normal in structure. Trivial mitral valve regurgitation. No evidence of mitral stenosis. 4. The aortic valve is normal in structure. Aortic valve regurgitation is mild. No aortic stenosis is present. Aortic regurgitation PHT measures 872 msec. 5. 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 6. The inferior vena cava is normal in size with greater than 50% respiratory variability, suggesting right atrial pressure of 3 mmHg. 7. Ascending aorta measurements are within normal limits for age when indexed to body surface area.  FINDINGS Left Ventricle: Left ventricular ejection fraction, by estimation, is 55 to 60%. Left ventricular ejection fraction by 3D volume is 58 %. The left ventricle has normal function. The left ventricle has no regional wall motion abnormalities. The average left ventricular global longitudinal strain is -23.2 %. Strain was performed and the global longitudinal strain is normal. The left ventricular internal cavity size was normal in size. There is no left ventricular hypertrophy. Left ventricular diastolic parameters are consistent with Grade I diastolic dysfunction (impaired relaxation). Normal left ventricular filling pressure.  Right Ventricle: The right ventricular size is severely enlarged. No increase in right ventricular wall thickness. Right ventricular systolic function is normal.  Left Atrium: Left atrial size was normal in size.  Right Atrium: Right atrial size was normal in size.  Pericardium: There is no evidence of pericardial effusion.  Mitral Valve: The mitral valve is normal in structure. Trivial mitral valve regurgitation. No evidence of mitral valve stenosis.  Tricuspid Valve: The tricuspid valve is normal in structure. Tricuspid valve regurgitation is mild . No evidence of tricuspid stenosis.  Aortic Valve: The aortic valve is normal in structure. Aortic valve  regurgitation is mild. Aortic regurgitation PHT measures 872 msec. No aortic stenosis is present.  Pulmonic Valve: The pulmonic valve was normal in structure. 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 No evidence of pulmonic stenosis.  Aorta: The aortic root is normal in size and structure. Ascending aorta measurements are within normal limits for age when indexed to body surface area.  Venous: The inferior vena cava is normal in size with greater than 50% respiratory variability, suggesting right atrial pressure of 3 mmHg.  IAS/Shunts: No atrial level shunt detected by color flow Doppler.  Additional Comments: 3D was performed not requiring image post processing on an independent workstation and was normal. A Very thickened moderator band is visualized.   LEFT VENTRICLE PLAX 2D LVIDd:         4.90 cm         Diastology LVIDs:         3.40 cm         LV e' medial:    5.98 cm/s LV PW:         1.00 cm         LV E/e' medial:  10.7 LV IVS:  1.00 cm         LV e' lateral:   13.70 cm/s LVOT diam:     2.10 cm         LV E/e' lateral: 4.6 LV SV:         70 LV SV Index:   32              2D LVOT Area:     3.46 cm        Longitudinal Strain 2D Strain GLS  -23.5 % (A2C): 2D Strain GLS  -23.7 % (A3C): 2D Strain GLS  -22.3 % (A4C): 2D Strain GLS  -23.2 % Avg:  3D Volume EF LV 3D EF:    Left ventricul ar ejection fraction by 3D volume is 58 %.  3D Volume EF: 3D EF:        58 % LV EDV:       160 ml LV ESV:       67 ml LV SV:        94 ml  RIGHT VENTRICLE             IVC RV Basal diam:  5.80 cm     IVC diam: 1.00 cm RV S prime:     12.55 cm/s  LEFT ATRIUM             Index        RIGHT ATRIUM           Index LA diam:        3.60 cm 1.64 cm/m   RA Area:     18.80 cm LA Vol (A2C):   40.3 ml 18.31 ml/m  RA Volume:   62.20 ml  28.25 ml/m LA Vol (A4C):   31.8 ml 14.45 ml/m LA Biplane Vol: 38.9 ml 17.67 ml/m AORTIC VALVE LVOT Vmax:   81.50 cm/s LVOT Vmean:  49.800  cm/s LVOT VTI:    0.201 m AI PHT:      872 msec  AORTA Ao Root diam: 3.80 cm Ao Asc diam:  3.90 cm  MITRAL VALVE               TRICUSPID VALVE MV Area (PHT): 3.15 cm    TR Peak grad:   35.5 mmHg MV Decel Time: 241 msec    TR Vmax:        298.00 cm/s MV E velocity: 63.70 cm/s MV A velocity: 82.55 cm/s  SHUNTS MV E/A ratio:  0.77        Systemic VTI:  0.20 m Systemic Diam: 2.10 cm  Wilbert Bihari MD Electronically signed by Wilbert Bihari MD Signature Date/Time: 02/15/2023/4:21:06 PM    Final      CT SCANS  CT CARDIAC SCORING (SELF PAY ONLY) 02/07/2023  Addendum 02/18/2023 10:50 AM ADDENDUM REPORT: 02/18/2023 10:48  EXAM: OVER-READ INTERPRETATION CT CHEST  The following report is an over-read performed by radiologist Dr. Norman Hopper of [facility] on 02/18/2023. This over-read does not include interpretation of cardiac or coronary anatomy or pathology. The coronary calcium  score interpretation by the cardiologist is attached.  COMPARISON:  Chest radiograph dated 06/26/2019.  FINDINGS: Cardiovascular: Vascular calcifications are seen in the thoracic aorta. The heart is enlarged. No pericardial effusion.  Mediastinum/Nodes: No enlarged mediastinal lymph nodes. The visible trachea and esophagus demonstrate no significant findings.  Lungs/Pleura: There is mild bronchiectasis and areas of bronchial plugging bilaterally. Mild scarring/atelectasis bilaterally.  Upper Abdomen: No acute abnormality.  Musculoskeletal: Degenerative changes are seen  in the spine.  IMPRESSION: 1. Mild bronchiectasis and areas of bronchial plugging bilaterally. 2. Cardiomegaly.  Aortic Atherosclerosis (ICD10-I70.0).   Electronically Signed By: Norman Hopper M.D. On: 02/18/2023 10:48  Narrative CLINICAL DATA:  Cardiovascular Disease Risk stratification  EXAM: Coronary Calcium  Score  TECHNIQUE: A gated, non-contrast computed tomography scan of the heart was performed  using 3mm slice thickness. Axial images were analyzed on a dedicated workstation. Calcium  scoring of the coronary arteries was performed using the Agatston method.  FINDINGS: Coronary Calcium  Score:  Left main: 10.5  Left anterior descending artery: 1187  Left circumflex artery: 119  Right coronary artery: 312  Total: 1628  Percentile: 94th  Pericardium: Moderate pericardial effusion.  Ascending Aorta: Evidence of ascending aortic dilation, 41.85mm, on non-contrasted study. Aortic atherosclerosis.  IMPRESSION: 1. Coronary calcium  score of 1628. This was 94th percentile for age-, race-, and sex-matched controls.  2.  Aortic atherosclerosis.  3. 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 Recommend annual imaging followup by CTA or MRA. This recommendation follows 2010 ACCF/AHA/AATS/ACR/ASA/SCA/SCAI/SIR/STS/SVM Guidelines for the Diagnosis and Management of Patients with Thoracic Aortic Disease. Circulation. 2010; 121: Z733-z630.  4. Moderate pericardial effusion, consider echocardiogram to further evaluate the severity and clinical significance.  5. Non-cardiac: See separate report from [facility].  RECOMMENDATIONS: Coronary artery calcium  (CAC) score is a strong predictor of incident coronary heart disease (CHD) and provides predictive information beyond traditional risk factors. CAC scoring is reasonable to use in the decision to withhold, postpone, or initiate statin therapy in intermediate-risk or selected borderline-risk asymptomatic adults (age 51-75 years and LDL-C >=70 to <190 mg/dL) who do not have diabetes or established atherosclerotic cardiovascular disease (ASCVD).* In intermediate-risk (10-year ASCVD risk >=7.5% to <20%) adults or selected borderline-risk (10-year ASCVD risk >=5% to <7.5%) adults in whom a CAC score is measured for the purpose of making a treatment decision the following recommendations have been  made:  If CAC=0, it is reasonable to withhold statin therapy and reassess in 5 to 10 years, as long as higher risk conditions are absent (diabetes mellitus, family history of premature CHD in first degree relatives (males <55 years; females <65 years), cigarette smoking, or LDL >=190 mg/dL).  If CAC is 1 to 99, it is reasonable to initiate statin therapy for patients >=52 years of age.  If CAC is >=100 or >=75th percentile, it is reasonable to initiate statin therapy at any age.  Cardiology referral should be considered for patients with CAC scores >=400 or >=75th percentile.  *2018 AHA/ACC/AACVPR/AAPA/ABC/ACPM/ADA/AGS/APhA/ASPC/NLA/PCNA Guideline on the Management of Blood Cholesterol: A Report of the American College of Cardiology/American Heart Association Task Force on Clinical Practice Guidelines. J Am Coll Cardiol. 2019;73(24):3168-3209.  Madonna Large, DO, [facility]  Electronically Signed: By: Madonna Large D.O. On: 02/08/2023 19:36     ______________________________________________________________________________________________     Recent Labs: 05/15/2023: ALT 34; BUN 25; Creatinine 1.45; Hemoglobin 13.8; Platelet Count 170; Potassium 4.4; Sodium 140  Recent Lipid Panel    Component Value Date/Time   CHOL 148 04/18/2023 1155   TRIG 217 (H) 04/18/2023 1155   HDL 41 04/18/2023 1155   CHOLHDL 3.6 04/18/2023 1155   LDLCALC 71 04/18/2023 1155    History of Present Illness    78 year old male with the above past medical history including nonobstructive CAD (coronary calcium  score 1628, 94th percentile), aortic atherosclerosis, mild dilation of the ascending aorta, aortic valve regurgitation, RBBB, hypertension, hyperlipidemia, gout, and prostate cancer.  He was referred to cardiology in January 2025  in the setting of elevated coronary calcium  score.  Coronary calcium  score in 02/2023 was 1628 (94th percentile). Lexiscan  Myoview  in 02/2023 showed no evidence of ischemia or  infarction, EF 51%. Echocardiogram in 02/2023 showed EF 55 to 60%, normal LV function, no RWMA, G1 DD, normal RV systolic function, mild aortic valve regurgitation, no evidence of aortic dilation, measurements were noted to be appropriate for height and weight.  He was last seen in the office on 04/13/2023 and was stable from a cardiac standpoint.  He denied symptoms concerning for angina. 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 No EKG available for review.  Patient reported generalized fatigue, otherwise asymptomatic.  He presents today for follow-up accompanied by his wife.  Since his last visit and since he contacted our office he has been stable from a cardiac standpoint.  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  He denies any palpitations, dizziness, presyncope, syncope, chest pain, dyspnea, edema, PND, orthopnea, weight gain.  Overall, he reports feeling well.   Home Medications    Current Outpatient Medications  Medication Sig Dispense Refill   allopurinol (ZYLOPRIM) 100 MG tablet Take 150 mg by mouth daily.     amLODipine  (NORVASC ) 10 MG tablet Take 1 tablet (10 mg total) by mouth daily. 90 tablet 3   aspirin EC 81 MG tablet Take 81 mg by mouth daily.     carvedilol (COREG) 25 MG tablet Take 25 mg by mouth 2 (two) times daily with a meal.      doxazosin (CARDURA) 4 MG tablet Take by mouth.     olmesartan (BENICAR) 40 MG tablet Take 40 mg by mouth daily.     rosuvastatin  (CRESTOR ) 20 MG tablet Take 1 tablet (20 mg total) by mouth daily. 90 tablet 2   spironolactone (ALDACTONE) 25 MG tablet Take 25 mg by mouth at bedtime.      No current facility-administered medications for this visit.     Review of Systems    He denies chest pain, palpitations, dyspnea, pnd, orthopnea, n, v, dizziness,  syncope, edema, weight gain, or early satiety. All other systems reviewed and are otherwise negative except as noted above.   Physical Exam    VS:  BP 126/74 (BP Location: Left Arm, Patient Position: Sitting, Cuff Size: Normal)   Pulse 67   Resp 16   Ht 5' 11 (1.803 m)   Wt 206 lb (93.4 kg)   SpO2 96%   BMI 28.73 kg/m  GEN: Well nourished, well developed, in no acute distress. HEENT: normal. Neck: Supple, no JVD, carotid bruits, or masses. Cardiac: RRR, no murmurs, rubs, or gallops. No clubbing, cyanosis, edema.  Radials/DP/PT 2+ and equal bilaterally.  Respiratory:  Respirations regular and unlabored, clear to auscultation bilaterally. GI: Soft, nontender, nondistended, BS + x 4. MS: no deformity or atrophy. Skin: warm and dry, no rash. Neuro:  Strength and sensation are intact. Psych: Normal affect.  Accessory Clinical Findings    ECG personally reviewed by me today - EKG Interpretation Date/Time:  Thursday October 11 2023 14:47:18 EDT Ventricular Rate:  67 PR Interval:  160 QRS Duration:  158 QT Interval:  400 QTC Calculation: 422 R Axis:   -64  Text Interpretation: Sinus rhythm with occasional Premature ventricular complexes Right bundle branch block Left anterior fascicular  block Bifascicular block When compared with ECG of 26-Jan-2023 10:06, Premature ventricular complexes are now Present Confirmed by Daneen Perkins ([id number]) on 10/11/2023 2:52:01 PM  - no acute changes.   Lab Results  Component Value Date   WBC 5.6 05/15/2023   HGB 13.8 05/15/2023   HCT 41.0 05/15/2023   MCV 90.9 05/15/2023   PLT 170 05/15/2023   Lab Results  Component Value Date   CREATININE 1.45 (H) 05/15/2023   BUN 25 (H) 05/15/2023   NA 140 05/15/2023   K 4.4 05/15/2023   CL 108 05/15/2023   CO2 28 05/15/2023   Lab Results  Component Value Date   ALT 34 05/15/2023   AST 22 05/15/2023   ALKPHOS 55 05/15/2023   BILITOT 0.7 05/15/2023   Lab Results  Component Value Date   CHOL 148  04/18/2023   HDL 41 04/18/2023   LDLCALC 71 04/18/2023   TRIG 217 (H) 04/18/2023   CHOLHDL 3.6 04/18/2023    No results found for: HGBA1C  Assessment & Plan    1. Bradycardia/RBBB/LAFB/PVCs: Echocardiogram in 02/2023 showed EF 55 to 60%, normal LV function, no RWMA, G1 DD, normal RV systolic function, mild aortic valve regurgitation, no evidence of aortic dilation.  History of bifascicular block.  He was at his dentist office awaiting tooth extraction and was noted to have variation in heart rate, concern for bradycardia.  He was asymptomatic.  He states his heart rate was being monitored with probes on his wrists. EKG today shows sinus rhythm with PVCs.  He denies any palpitations, dizziness, presyncope, syncope, chest pain, dyspnea. Euvolemic well compensated on exam. Suspect bradycardia was not true bradycardia, but rather occurred in the setting of PVCs.  Prior cardiac workup reassuring.  Will check CBC, BMET, TSH, magnesium, will check 7-day ZIO monitor.  If monitor unrevealing, he will be cleared to proceed with dental extraction.  If high PVC burden, consider repeat echocardiogram.  Reviewed ED precautions.  2. Nonobstructive CAD/aortic atherosclerosis: Coronary calcium  score in 02/2023 was 1628 (94th percentile). Lexiscan  Myoview  in 02/2023 showed no evidence of ischemia or infarction, EF 51%. Stable with no anginal symptoms. No indication for ischemic evaluation.  Continue aspirin, carvedilol, olmesartan, spironolactone, amlodipine , doxazosin, Crestor .  3. Mild dilation of ascending aorta: Measured 41.7 mm on prior CT, however, this was noted to be appropriate for height and weight.  No evidence of aortic dilation on most recent echocardiogram. BP well-controlled.  No indication for further testing at this time.  4. Aortic valve regurgitation: Asymptomatic. Consider repeat echocardiogram in 3 to 5 years, sooner if clinically indicated.   5. Hypertension: BP well controlled. Continue current  antihypertensive regimen.   6. Hyperlipidemia: LDL was 75 in 05/2023, slightly above goal.  Continue Crestor .  7. Disposition:  Follow-up in 6 to 8 weeks with Dr. Elmira or APP.       Perkins JAYSON Daneen, NP 10/11/2023, 4:48 PM

## 2023-10-11 NOTE — Telephone Encounter (Signed)
 Pt would like a c/b regarding HR fluctuating. Pt states that he was at the Dentist this morning to have extraction done and couldn't due to HR going up and down. Pt was told to call our office to get recommendations. Please advise

## 2023-10-11 NOTE — Telephone Encounter (Signed)
 Received a fax from [facility] explaining pt was to have tooth removal today with Dr. Ozell Hurdle when HR noted to be 30-40's with a few PVC's then up to 50-60's. Also having sinus pauses.  Procedure was aborted d/t EKG changes. Spoke with PCP office staff member [place] reports pt really needs an OV with our office today.  Advised will need to triage pt before scheduling OV need to make sure pt is appropriate for OV first.  Asberry is working on getting EKG tracing sent to our office.  Called pt reports does feel fatigue at times but for the most part feels fine.  Denies feeling lightheaded or dizzy Has a smart watch that monitors HR.  Reports HR normally 56-60's.  BP 120-130's/80-90. Denies feeling palpitations.  Continues to take carvedilol 25 mg PO BID.    Received paperwork from dental office.  No EKG tracings to support fax received from PCP office.  Spoke with DOD advises if pt feels fine it is okay to schedule OV with next available provider.   Called pt advised of ED precautions.  Scheduled OV for today at 2:45 pm.  Provided with office location and answered all questions.

## 2023-10-11 NOTE — Patient Instructions (Signed)
 Medication Instructions:  Your physician recommends that you continue on your current medications as directed. Please refer to the Current Medication list given to you today.  *If you need a refill on your cardiac medications before your next appointment, please call your pharmacy*  Lab Work: CBC, BMET, TSH, and Mag today If you have labs (blood work) drawn today and your tests are completely normal, you will receive your results only by: MyChart Message (if you have MyChart) OR A paper copy in the mail If you have any lab test that is abnormal or we need to change your treatment, we will call you to review the results.  Testing/Procedures: 7 Day-non live Zio monitor  Follow-Up: At [facility], you and your health needs are our priority.  As part of our continuing mission to provide you with exceptional heart care, our providers are all part of one team.  This team includes your primary Cardiologist (physician) and Advanced Practice Providers or APPs (Physician Assistants and Nurse Practitioners) who all work together to provide you with the care you need, when you need it.  Your next appointment:   8 week(s)  Provider:   Damien Braver, NP          We recommend signing up for the patient portal called MyChart.  Sign up information is provided on this After Visit Summary.  MyChart is used to connect with patients for Virtual Visits (Telemedicine).  Patients are able to view lab/test results, encounter notes, upcoming appointments, etc.  Non-urgent messages can be sent to your provider as well.   To learn more about what you can do with MyChart, go to [email or web address].   Other Instructions  ZIO XT- Long Term Monitor Instructions  Your physician has requested you wear a ZIO patch monitor for 7 days.  This is a single patch monitor. [employer] supplies one patch monitor per enrollment. Additional stickers are not available. Please do not apply patch if you will be having a  Nuclear Stress Test,  Echocardiogram, Cardiac CT, MRI, or Chest Xray during the period you would be wearing the  monitor. The patch cannot be worn during these tests. You cannot remove and re-apply the  ZIO XT patch monitor.   Billing and Patient Assistance Program Information  We have supplied [employer] with any of your insurance information on file for billing purposes. [employer] offers a sliding scale Patient Assistance Program for patients that do not have  insurance, or whose insurance does not completely cover the cost of the ZIO monitor.  You must apply for the Patient Assistance Program to qualify for this discounted rate.  To apply, please call [employer] at [phone], select option 4, select option 2, ask to apply for  Patient Assistance Program. Meredeth will ask your household income, and how many people  are in your household. They will quote your out-of-pocket cost based on that information.  [employer] will also be able to set up a 39-month, interest-free payment plan if needed.    Call [facility] Customer Care at [phone] if you have questions regarding  your ZIO XT patch monitor. Call them immediately if you see an orange light blinking on your  monitor.  If your monitor falls off in less than 4 days, contact our Monitor department at [phone].  If your monitor becomes loose or falls off after 4 days call [employer] at [phone] for  suggestions on securing your monitor

## 2023-10-11 NOTE — Progress Notes (Unsigned)
 Applied a 7 day Zio XT monitor to patient in the office

## 2023-10-12 LAB — BASIC METABOLIC PANEL WITH GFR
BUN/Creatinine Ratio: 18 (ref 10–24)
BUN: 27 mg/dL (ref 8–27)
CO2: 22 mmol/L (ref 20–29)
Calcium: 11.6 mg/dL — AB (ref 8.6–10.2)
Chloride: 105 mmol/L (ref 96–106)
Creatinine, Ser: 1.52 mg/dL — AB (ref 0.76–1.27)
Glucose: 87 mg/dL (ref 70–99)
Potassium: 4.7 mmol/L (ref 3.5–5.2)
Sodium: 142 mmol/L (ref 134–144)
eGFR: 47 mL/min/1.73 — AB (ref >59)

## 2023-10-12 LAB — TSH: TSH: 1.22 u[IU]/mL (ref 0.450–4.500)

## 2023-10-12 LAB — MAGNESIUM: Magnesium: 1.9 mg/dL (ref 1.6–2.3)

## 2023-10-12 LAB — CBC
Hematocrit: 43.9 % (ref 37.5–51.0)
Hemoglobin: 14.4 g/dL (ref 13.0–17.7)
MCH: 30.8 pg (ref 26.6–33.0)
MCHC: 32.8 g/dL (ref 31.5–35.7)
MCV: 94 fL (ref 79–97)
Platelets: 173 x10E3/uL (ref 150–450)
RBC: 4.68 x10E6/uL (ref 4.14–5.80)
RDW: 14.1 % (ref 11.6–15.4)
WBC: 6.8 x10E3/uL (ref 3.4–10.8)

## 2023-10-16 ENCOUNTER — Ambulatory Visit: Payer: Self-pay | Admitting: Nurse Practitioner

## 2023-10-24 ENCOUNTER — Ambulatory Visit: Admitting: Cardiology

## 2023-11-04 DIAGNOSIS — R001 Bradycardia, unspecified: Secondary | ICD-10-CM | POA: Diagnosis not present

## 2023-11-07 ENCOUNTER — Telehealth (HOSPITAL_BASED_OUTPATIENT_CLINIC_OR_DEPARTMENT_OTHER): Payer: Self-pay

## 2023-11-07 ENCOUNTER — Telehealth: Payer: Self-pay | Admitting: Cardiology

## 2023-11-07 NOTE — Telephone Encounter (Signed)
   Pre-operative Risk Assessment    Patient Name: Danny Duran  DOB: May 21, 1945 MRN: [id number]   Date of last office visit: 10/11/23 with [facility] Date of next office visit: 11/23/23 with [facility]  Request for Surgical Clearance    Procedure:  Dental Extraction - Amount of Teeth to be Pulled:  1 molar- surgical  Date of Surgery:  Clearance TBD                                 Surgeon:  Dr. Ozell Hurdle Surgeon's Group or Practice Name:  [facility] Phone number:  [phone] Fax number:  [phone]   Type of Clearance Requested:   - Medical  - Pharmacy:  Hold Aspirin not indicated   Type of Anesthesia:  under conscious sedation ( Midazolam  and Fentanyl )   Additional requests/questions:    [name] [name]   11/07/2023, 4:01 PM

## 2023-11-07 NOTE — Telephone Encounter (Signed)
   Name: Danny Duran  DOB: [birth year]  MRN: [id number]  Primary Cardiologist: Newman JINNY Conley Pawling, MD  Chart reviewed as part of pre-operative protocol coverage. The patient has an upcoming visit scheduled with  Damien Braver, DNP on 11/23/2023 at which time clearance can be addressed in case there are any issues that would impact surgical recommendations.  Surgical extraction of 1 molar Is not scheduled until TBD as below. I added preop FYI to appointment note so that provider is aware to address at time of outpatient visit.  Per office protocol the cardiology provider should forward their finalized clearance decision and recommendations regarding antiplatelet therapy to the requesting party below.    This message will also be routed to Damien Braver, DNP for input on holding ASA as requested below so that this information is available to the clearing provider at time of patient's appointment.   I will route this message as FYI to requesting party and remove this message from the preop box as separate preop APP input not needed at this time.   Please call with any questions.  Lamarr Satterfield, NP  11/07/2023, 4:09 PM

## 2023-11-07 NOTE — Telephone Encounter (Signed)
 Pt has upcoming appt clearance can be addressed at ov and note has been made to appt line that preop clearance is needed

## 2023-11-07 NOTE — Telephone Encounter (Signed)
 Calling to get progressive notes for the patient and to speak with the nurse. Please advise

## 2023-11-07 NOTE — Telephone Encounter (Signed)
 Spoke with Danny Duran who is calling to let us  know that he is going to fax over the patient's EKG and vital from recent dental procedure that was requested by Damien Braver. He was also wanting an update on the patient to see if he is cleared to have his dental procedure now. Advised that a heart monitor was worn after his last visit and we are waiting for the final reading on that report to determine next steps and clearance for any upcoming procedures.

## 2023-11-08 NOTE — Progress Notes (Signed)
 If asymptomatic, okay to monitor for now.  If he has symptomatic SVT, could consider referral to EP for possibility of ablation. 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  Normal EF on echocardiogram in 02/2023.  Okay to continue carvedilol.  1 episode of Mobitz 1 AV block without symptoms should not be too concerning.  Thanks MJP

## 2023-11-23 ENCOUNTER — Encounter: Payer: Self-pay | Admitting: Nurse Practitioner

## 2023-11-23 ENCOUNTER — Ambulatory Visit: Attending: Nurse Practitioner | Admitting: Nurse Practitioner

## 2023-11-23 VITALS — BP 118/76 | HR 61 | Ht 71.0 in | Wt 209.0 lb

## 2023-11-23 DIAGNOSIS — R001 Bradycardia, unspecified: Secondary | ICD-10-CM | POA: Diagnosis not present

## 2023-11-23 DIAGNOSIS — I2510 Atherosclerotic heart disease of native coronary artery without angina pectoris: Secondary | ICD-10-CM

## 2023-11-23 DIAGNOSIS — E785 Hyperlipidemia, unspecified: Secondary | ICD-10-CM

## 2023-11-23 DIAGNOSIS — I493 Ventricular premature depolarization: Secondary | ICD-10-CM

## 2023-11-23 DIAGNOSIS — I441 Atrioventricular block, second degree: Secondary | ICD-10-CM

## 2023-11-23 DIAGNOSIS — Z01810 Encounter for preprocedural cardiovascular examination: Secondary | ICD-10-CM | POA: Diagnosis not present

## 2023-11-23 DIAGNOSIS — I351 Nonrheumatic aortic (valve) insufficiency: Secondary | ICD-10-CM

## 2023-11-23 DIAGNOSIS — I4510 Unspecified right bundle-branch block: Secondary | ICD-10-CM

## 2023-11-23 DIAGNOSIS — I10 Essential (primary) hypertension: Secondary | ICD-10-CM

## 2023-11-23 NOTE — Progress Notes (Signed)
 Office Visit    Patient Name: Danny Duran Date of Encounter: 11/23/2023  Primary Care Provider:  Valentin Skates, DO Primary Cardiologist:  Newman JINNY Lawrence, MD  Chief Complaint    78 year old male with a history of nonobstructive CAD (coronary calcium  score 1628, 94th percentile), aortic atherosclerosis, mild dilation of the ascending aorta, aortic valve regurgitation, RBBB, hypertension, hyperlipidemia, gout, and prostate cancer who presents for follow-up related to PVCs, and for preoperative cardiac evaluation  Past Medical History    Past Medical History:  Diagnosis Date   Chronic gout    07-18-2019  per pt last episode left great toe approx. 11/ 2020   Hyperlipidemia    Hypertension    followed by pcp   (07-18-2019 per pt had one yrs ago, was told ok)   Nocturia    Prostate cancer [facility]) urologist--- dr dahlstedt/  oncology-- dr patrcia   dx 04/ 2021  Stage T1c, Gleason 3+4   RBBB (right bundle branch block)    Thrombocytopenia    Wears glasses    Past Surgical History:  Procedure Laterality Date   CYSTOSCOPY N/A 07/28/2019   Procedure: CYSTOSCOPY FLEXIBLE;  Surgeon: Matilda Senior, MD;  Location: [facility];  Service: Urology;  Laterality: N/A;   NO PAST SURGERIES     PROSTATE BIOPSY     RADIOACTIVE SEED IMPLANT N/A 07/28/2019   Procedure: RADIOACTIVE SEED IMPLANT/BRACHYTHERAPY IMPLANT;  Surgeon: Matilda Senior, MD;  Location: [facility];  Service: Urology;  Laterality: N/A;  90 MINS   SPACE OAR INSTILLATION N/A 07/28/2019   Procedure: SPACE OAR INSTILLATION;  Surgeon: Matilda Senior, MD;  Location: [facility];  Service: Urology;  Laterality: N/A;    Allergies  Allergies  Allergen Reactions   Diltiazem     Other Reaction(s): Unknown   Pravastatin     Other Reaction(s): cough, irregular heart beat 1996     Labs/Other Studies Reviewed    The following studies were reviewed today:  Cardiac  Studies & Procedures   ______________________________________________________________________________________________   STRESS TESTS  MYOCARDIAL PERFUSION IMAGING 02/05/2023  Interpretation Summary   LV perfusion is normal. There is no evidence of ischemia. There is no evidence of infarction.  Diaphragm attenuation noted.   Left ventricular function is normal. Nuclear stress EF: 51%. The left ventricular ejection fraction is mildly decreased (45-54%). End diastolic cavity size is normal.   The study is normal. The study is low risk.   ECHOCARDIOGRAM  ECHOCARDIOGRAM COMPLETE 02/15/2023  Narrative ECHOCARDIOGRAM REPORT    Patient Name:   Danny Duran Date of Exam: 02/15/2023 Medical Rec #:  [id number]      Height:       72.0 in Accession #:    [id number]     Weight:       216.0 lb Date of Birth:  12/21/1945      BSA:          2.201 m Patient Age:    77 years       BP:           124/64 mmHg Patient Gender: M              HR:           52 bpm. Exam Location:  [address]  Procedure: 2D Echo, 3D Echo, Cardiac Doppler, Color Doppler and Strain Analysis (Both Spectral and Color Flow Doppler were utilized during procedure).  Indications:    R94.31 Abnormal EKG  History:  Patient has no prior history of Echocardiogram examinations. Risk Factors:Hypertension and Dyslipidemia.  Sonographer:    Carl Coma RDCS Referring Phys: [id number] [facility] J PATWARDHAN  IMPRESSIONS   1. Left ventricular ejection fraction, by estimation, is 55 to 60%. Left ventricular ejection fraction by 3D volume is 58 %. The left ventricle has normal function. The left ventricle has no regional wall motion abnormalities. Left ventricular diastolic parameters are consistent with Grade I diastolic dysfunction (impaired relaxation). The average left ventricular global longitudinal strain is -23.2 %. The global longitudinal strain is normal. 2. Right ventricular systolic function is normal. The  right ventricular size is severely enlarged. A Very thickened moderator band is visualized. 3. The mitral valve is normal in structure. Trivial mitral valve regurgitation. No evidence of mitral stenosis. 4. The aortic valve is normal in structure. Aortic valve regurgitation is mild. No aortic stenosis is present. Aortic regurgitation PHT measures 872 msec. 5. 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 6. The inferior vena cava is normal in size with greater than 50% respiratory variability, suggesting right atrial pressure of 3 mmHg. 7. Ascending aorta measurements are within normal limits for age when indexed to body surface area.  FINDINGS Left Ventricle: Left ventricular ejection fraction, by estimation, is 55 to 60%. Left ventricular ejection fraction by 3D volume is 58 %. The left ventricle has normal function. The left ventricle has no regional wall motion abnormalities. The average left ventricular global longitudinal strain is -23.2 %. Strain was performed and the global longitudinal strain is normal. The left ventricular internal cavity size was normal in size. There is no left ventricular hypertrophy. Left ventricular diastolic parameters are consistent with Grade I diastolic dysfunction (impaired relaxation). Normal left ventricular filling pressure.  Right Ventricle: The right ventricular size is severely enlarged. No increase in right ventricular wall thickness. Right ventricular systolic function is normal.  Left Atrium: Left atrial size was normal in size.  Right Atrium: Right atrial size was normal in size.  Pericardium: There is no evidence of pericardial effusion.  Mitral Valve: The mitral valve is normal in structure. Trivial mitral valve regurgitation. No evidence of mitral valve stenosis.  Tricuspid Valve: The tricuspid valve is normal in structure. Tricuspid valve regurgitation is mild . No evidence of tricuspid stenosis.  Aortic Valve: The aortic valve is normal in  structure. Aortic valve regurgitation is mild. Aortic regurgitation PHT measures 872 msec. No aortic stenosis is present.  Pulmonic Valve: The pulmonic valve was normal in structure. 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 No evidence of pulmonic stenosis.  Aorta: The aortic root is normal in size and structure. Ascending aorta measurements are within normal limits for age when indexed to body surface area.  Venous: The inferior vena cava is normal in size with greater than 50% respiratory variability, suggesting right atrial pressure of 3 mmHg.  IAS/Shunts: No atrial level shunt detected by color flow Doppler.  Additional Comments: 3D was performed not requiring image post processing on an independent workstation and was normal. A Very thickened moderator band is visualized.   LEFT VENTRICLE PLAX 2D LVIDd:         4.90 cm         Diastology LVIDs:         3.40 cm         LV e' medial:    5.98 cm/s LV PW:         1.00 cm         LV E/e' medial:  10.7 LV IVS:  1.00 cm         LV e' lateral:   13.70 cm/s LVOT diam:     2.10 cm         LV E/e' lateral: 4.6 LV SV:         70 LV SV Index:   32              2D LVOT Area:     3.46 cm        Longitudinal Strain 2D Strain GLS  -23.5 % (A2C): 2D Strain GLS  -23.7 % (A3C): 2D Strain GLS  -22.3 % (A4C): 2D Strain GLS  -23.2 % Avg:  3D Volume EF LV 3D EF:    Left ventricul ar ejection fraction by 3D volume is 58 %.  3D Volume EF: 3D EF:        58 % LV EDV:       160 ml LV ESV:       67 ml LV SV:        94 ml  RIGHT VENTRICLE             IVC RV Basal diam:  5.80 cm     IVC diam: 1.00 cm RV S prime:     12.55 cm/s  LEFT ATRIUM             Index        RIGHT ATRIUM           Index LA diam:        3.60 cm 1.64 cm/m   RA Area:     18.80 cm LA Vol (A2C):   40.3 ml 18.31 ml/m  RA Volume:   62.20 ml  28.25 ml/m LA Vol (A4C):   31.8 ml 14.45 ml/m LA Biplane Vol: 38.9 ml 17.67 ml/m AORTIC VALVE LVOT Vmax:   81.50  cm/s LVOT Vmean:  49.800 cm/s LVOT VTI:    0.201 m AI PHT:      872 msec  AORTA Ao Root diam: 3.80 cm Ao Asc diam:  3.90 cm  MITRAL VALVE               TRICUSPID VALVE MV Area (PHT): 3.15 cm    TR Peak grad:   35.5 mmHg MV Decel Time: 241 msec    TR Vmax:        298.00 cm/s MV E velocity: 63.70 cm/s MV A velocity: 82.55 cm/s  SHUNTS MV E/A ratio:  0.77        Systemic VTI:  0.20 m Systemic Diam: 2.10 cm  Wilbert Bihari MD Electronically signed by Wilbert Bihari MD Signature Date/Time: 02/15/2023/4:21:06 PM    Final    MONITORS  LONG TERM MONITOR (3-14 DAYS) 10/29/2023  Narrative Zio patch monitor 8 days 10/11/2023 - 10/19/2023: Dominant rhythm: Sinus. HR 37-106 bpm. Avg HR 67 bpm, in sinus rhythm w/bundle branch block/IVCD. 3 episodes of SVT, fastest and longest at 125 bpm for 27.7secs. <1% isolated SVE,  couplet/triplets. 0 episodes of VT. 1 episode of second Degree AV Block-Mobitz I (Wenckebach) noted (1.59 PM). 10.7% isolated VE, 1.7% couplets, <1% triplets. No atrial fibrillation/atrial flutter/VT/high grade AV block, sinus pause >3sec noted. 0 patient triggered events.   CT SCANS  CT CARDIAC SCORING (SELF PAY ONLY) 02/07/2023  Addendum 02/18/2023 10:50 AM ADDENDUM REPORT: 02/18/2023 10:48  EXAM: OVER-READ INTERPRETATION CT CHEST  The following report is an over-read performed by radiologist Dr. Norman Hopper of [facility] on 02/18/2023. This over-read  does not include interpretation of cardiac or coronary anatomy or pathology. The coronary calcium  score interpretation by the cardiologist is attached.  COMPARISON:  Chest radiograph dated 06/26/2019.  FINDINGS: Cardiovascular: Vascular calcifications are seen in the thoracic aorta. The heart is enlarged. No pericardial effusion.  Mediastinum/Nodes: No enlarged mediastinal lymph nodes. The visible trachea and esophagus demonstrate no significant findings.  Lungs/Pleura: There is mild  bronchiectasis and areas of bronchial plugging bilaterally. Mild scarring/atelectasis bilaterally.  Upper Abdomen: No acute abnormality.  Musculoskeletal: Degenerative changes are seen in the spine.  IMPRESSION: 1. Mild bronchiectasis and areas of bronchial plugging bilaterally. 2. Cardiomegaly.  Aortic Atherosclerosis (ICD10-I70.0).   Electronically Signed By: Norman Hopper M.D. On: 02/18/2023 10:48  Narrative CLINICAL DATA:  Cardiovascular Disease Risk stratification  EXAM: Coronary Calcium  Score  TECHNIQUE: A gated, non-contrast computed tomography scan of the heart was performed using 3mm slice thickness. Axial images were analyzed on a dedicated workstation. Calcium  scoring of the coronary arteries was performed using the Agatston method.  FINDINGS: Coronary Calcium  Score:  Left main: 10.5  Left anterior descending artery: 1187  Left circumflex artery: 119  Right coronary artery: 312  Total: 1628  Percentile: 94th  Pericardium: Moderate pericardial effusion.  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 Aortic atherosclerosis.  IMPRESSION: 1. Coronary calcium  score of 1628. This was 94th percentile for age-, race-, and sex-matched controls.  2.  Aortic atherosclerosis.  3. Evidence of ascending aortic dilation, 41.27mm, on non-contrasted study. Recommend annual imaging followup by CTA or MRA. This recommendation follows 2010 ACCF/AHA/AATS/ACR/ASA/SCA/SCAI/SIR/STS/SVM Guidelines for the Diagnosis and Management of Patients with Thoracic Aortic Disease. Circulation. 2010; 121: Z733-z630.  4. Moderate pericardial effusion, consider echocardiogram to further evaluate the severity and clinical significance.  5. Non-cardiac: See separate report from [facility].  RECOMMENDATIONS: Coronary artery calcium  (CAC) score is a strong predictor of incident coronary heart disease (CHD) and provides  predictive information beyond traditional risk factors. CAC scoring is reasonable to use in the decision to withhold, postpone, or initiate statin therapy in intermediate-risk or selected borderline-risk asymptomatic adults (age 45-75 years and LDL-C >=70 to <190 mg/dL) who do not have diabetes or established atherosclerotic cardiovascular disease (ASCVD).* In intermediate-risk (10-year ASCVD risk >=7.5% to <20%) adults or selected borderline-risk (10-year ASCVD risk >=5% to <7.5%) adults in whom a CAC score is measured for the purpose of making a treatment decision the following recommendations have been made:  If CAC=0, it is reasonable to withhold statin therapy and reassess in 5 to 10 years, as long as higher risk conditions are absent (diabetes mellitus, family history of premature CHD in first degree relatives (males <55 years; females <65 years), cigarette smoking, or LDL >=190 mg/dL).  If CAC is 1 to 99, it is reasonable to initiate statin therapy for patients >=32 years of age.  If CAC is >=100 or >=75th percentile, it is reasonable to initiate statin therapy at any age.  Cardiology referral should be considered for patients with CAC scores >=400 or >=75th percentile.  *2018 AHA/ACC/AACVPR/AAPA/ABC/ACPM/ADA/AGS/APhA/ASPC/NLA/PCNA Guideline on the Management of Blood Cholesterol: A Report of the American College of Cardiology/American Heart Association Task Force on Clinical Practice Guidelines. J Am Coll Cardiol. 2019;73(24):3168-3209.  Madonna Large, DO, [facility]  Electronically Signed: By: Madonna Large D.O. On: 02/08/2023 19:36     ______________________________________________________________________________________________     Recent Labs: 05/15/2023: ALT 34 10/11/2023: BUN 27; Creatinine, Ser 1.52; Hemoglobin 14.4; Magnesium 1.9; Platelets 173; Potassium 4.7; Sodium 142; TSH 1.220  Recent Lipid Panel    Component Value Date/Time   CHOL 148 04/18/2023 1155    TRIG 217 (H) 04/18/2023 1155   HDL 41 04/18/2023 1155   CHOLHDL 3.6 04/18/2023 1155   LDLCALC 71 04/18/2023 1155    History of Present Illness    78 year old male with the above past medical history including nonobstructive CAD (coronary calcium  score 1628, 94th percentile), aortic atherosclerosis, mild dilation of the ascending aorta, aortic valve regurgitation, RBBB, hypertension, hyperlipidemia, gout, and prostate cancer.   He was referred to cardiology in January 2025 in the setting of elevated coronary calcium  score.  Coronary calcium  score in 02/2023 was 1628 (94th percentile). Lexiscan  Myoview  in 02/2023 showed no evidence of ischemia or infarction, EF 51%. Echocardiogram in 02/2023 showed EF 55 to 60%, normal LV function, no RWMA, G1 DD, normal RV systolic function, mild aortic valve regurgitation, no evidence of aortic dilation, measurements were noted to be appropriate for height and weight. 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 He was last seen in the office on 10/11/2023 and was stable from a cardiac standpoint. He reported mild fatigue (this was noted to likely be in the setting of long work hours, he drives a schoolbus), otherwise asymptomatic.  ZIO monitor in 10/2023 revealed predominantly normal sinus rhythm, average HR 67 bpm, bundle branch block, IVCD, 1 episode of second-degree AV block Mobitz type I, 3 episodes of SVT, longest lasting 27.7 seconds, frequent PVCs (10.7%), rare PACs, no significant arrhythmia.  Monitor results were reviewed with Dr. Elmira who recommended continuing carvedilol at present dose.   He presents today for follow-up.  Since his last visit he has done well from a cardiac standpoint.  He denies any chest pain, palpitations, dizziness, presyncope, syncope, dyspnea, edema, PND orthopnea, weight gain.  He shares that he previously took a blood pressure medication that he  was told caused irregular heart beat, though he does not recall the name of this medication.  In reviewing chart, question whether or not this was HCTZ.  This has since been discontinued.  He remains asymptomatic.  Overall, he reports feeling well.  Home Medications    Current Outpatient Medications  Medication Sig Dispense Refill   allopurinol (ZYLOPRIM) 100 MG tablet Take 150 mg by mouth daily.     amLODipine  (NORVASC ) 10 MG tablet Take 1 tablet (10 mg total) by mouth daily. 90 tablet 3   aspirin EC 81 MG tablet Take 81 mg by mouth daily.     carvedilol (COREG) 25 MG tablet Take 25 mg by mouth 2 (two) times daily with a meal.      doxazosin (CARDURA) 4 MG tablet Take by mouth.     olmesartan (BENICAR) 40 MG tablet Take 40 mg by mouth daily.     rosuvastatin  (CRESTOR ) 20 MG tablet Take 1 tablet (20 mg total) by mouth daily. 90 tablet 2   spironolactone (ALDACTONE) 25 MG tablet Take 25 mg by mouth at bedtime.      No current facility-administered medications for this visit.     Review of Systems    He denies chest pain, palpitations, dyspnea, pnd, orthopnea, n, v, dizziness, syncope, edema, weight gain, or early satiety. All other systems reviewed and are otherwise negative except as noted above.   Physical Exam    VS:  BP 118/76 (BP Location: Left Arm, Patient Position: Sitting, Cuff Size: Large)   Pulse 61   Ht  5' 11 (1.803 m)   Wt 209 lb (94.8 kg)   SpO2 96%   BMI 29.15 kg/m  GEN: Well nourished, well developed, in no acute distress. HEENT: normal. Neck: Supple, no JVD, carotid bruits, or masses. Cardiac: RRR, no murmurs, rubs, or gallops. No clubbing, cyanosis, edema.  Radials/DP/PT 2+ and equal bilaterally.  Respiratory:  Respirations regular and unlabored, clear to auscultation bilaterally. GI: Soft, nontender, nondistended, BS + x 4. MS: no deformity or atrophy. Skin: warm and dry, no rash. Neuro:  Strength and sensation are intact. Psych: Normal affect.  Accessory  Clinical Findings    ECG personally reviewed by me today - EKG Interpretation Date/Time:  Friday November 23 2023 09:11:08 EST Ventricular Rate:  61 PR Interval:  158 QRS Duration:  158 QT Interval:  406 QTC Calculation: 408 R Axis:   -58  Text Interpretation: Sinus rhythm with sinus arrhythmia with occasional Premature ventricular complexes Right bundle branch block Left anterior fascicular block Bifascicular block Minimal voltage criteria for LVH, may be normal variant ( R in aVL ) When compared with ECG of 11-Oct-2023 14:47, No significant change was found Confirmed by Daneen Perkins ([id number]) on 11/23/2023 9:13:47 AM  - no acute changes.   Lab Results  Component Value Date   WBC 6.8 10/11/2023   HGB 14.4 10/11/2023   HCT 43.9 10/11/2023   MCV 94 10/11/2023   PLT 173 10/11/2023   Lab Results  Component Value Date   CREATININE 1.52 (H) 10/11/2023   BUN 27 10/11/2023   NA 142 10/11/2023   K 4.7 10/11/2023   CL 105 10/11/2023   CO2 22 10/11/2023   Lab Results  Component Value Date   ALT 34 05/15/2023   AST 22 05/15/2023   ALKPHOS 55 05/15/2023   BILITOT 0.7 05/15/2023   Lab Results  Component Value Date   CHOL 148 04/18/2023   HDL 41 04/18/2023   LDLCALC 71 04/18/2023   TRIG 217 (H) 04/18/2023   CHOLHDL 3.6 04/18/2023    No results found for: HGBA1C  Assessment & Plan    1. Bradycardia/RBBB/LAFB/PVCs: Echocardiogram in 02/2023 showed EF 55 to 60%, normal LV function, no RWMA, G1 DD, normal RV systolic function, mild aortic valve regurgitation, no evidence of aortic dilation. History of bifascicular block. He was at his dentist office awaiting tooth extraction and was noted to have variation in heart rate, concern for bradycardia. He was asymptomatic (of note, heart rate was being monitored with probes on his wrist). ZIO monitor in 10/2023 revealed predominantly normal sinus rhythm, average HR 67 bpm, bundle branch block, IVCD, 1 episode of second-degree AV block Mobitz  type I, 3 episodes of SVT, longest lasting 27.7 seconds, frequent PVCs (10.7%), rare PACs, no significant arrhythmia. Monitor results were reviewed with Dr. Elmira who recommended continuing carvedilol at present dose.  Continue to monitor for any symptoms.  Workup to date reassuring.  Reviewed ED precautions.  Continue carvedilol.   2. Nonobstructive CAD/aortic atherosclerosis: Coronary calcium  score in 02/2023 was 1628 (94th percentile). Lexiscan  Myoview  in 02/2023 showed no evidence of ischemia or infarction, EF 51%. Stable with no anginal symptoms. No indication for ischemic evaluation.  Continue aspirin, carvedilol, olmesartan, spironolactone, amlodipine , doxazosin, Crestor .   3. Mild dilation of ascending aorta: Measured 41.7 mm on prior CT, however, this was noted to be appropriate for height and weight.  No evidence of aortic dilation on most recent echocardiogram. BP well-controlled.  No indication for further testing at this time.   4.  Aortic valve regurgitation: Asymptomatic. Consider repeat echocardiogram in 3 to 5 years, sooner if clinically indicated.    5. Hypertension: BP well controlled. Continue current antihypertensive regimen.    6. Hyperlipidemia: LDL was 75 in 05/2023, slightly above goal.  Continue Crestor .   7. Preoperative cardiac exam: According to the Revised Cardiac Risk Index (RCRI), his Perioperative Risk of Major Cardiac Event is (%): 0.4. His Functional Capacity in METs is: 7.99 according to the Duke Activity Status Index (DASI). Therefore, based on ACC/AHA guidelines, patient would be at acceptable risk for the planned procedure without further cardiovascular testing.  Recent cardiac monitor as above.  He does have frequent PVCs, he is asymptomatic.  He has had a reassuring stress test and echocardiogram.  Per Dr. Elmira, as he remains asymptomatic, he is okay to proceed with dental extraction.  SBE prophylaxis is not required for this patient from a cardiology  standpoint. Regarding ASA therapy, we recommend continuation of ASA throughout the perioperative period.  However, if the surgeon feels that cessation of ASA is required in the perioperative period, it may be stopped 5-7 days prior to surgery with a plan to resume it as soon as felt to be feasible from a surgical standpoint in the post-operative period. I will route this recommendation to the requesting party via Epic fax function.  8. Disposition:  Follow-up in 6 months.       Damien JAYSON Braver, NP 11/23/2023, 9:48 AM

## 2023-11-23 NOTE — Patient Instructions (Signed)
 Medication Instructions:  Your physician recommends that you continue on your current medications as directed. Please refer to the Current Medication list given to you today.  *If you need a refill on your cardiac medications before your next appointment, please call your pharmacy*  Lab Work: NONE ordered at this time of appointment   Testing/Procedures: NONE ordered at this time of appointment   Follow-Up: At [facility], you and your health needs are our priority.  As part of our continuing mission to provide you with exceptional heart care, our providers are all part of one team.  This team includes your primary Cardiologist (physician) and Advanced Practice Providers or APPs (Physician Assistants and Nurse Practitioners) who all work together to provide you with the care you need, when you need it.  Your next appointment:   6 month(s)  Provider:   Newman JINNY Lawrence, MD    We recommend signing up for the patient portal called MyChart.  Sign up information is provided on this After Visit Summary.  MyChart is used to connect with patients for Virtual Visits (Telemedicine).  Patients are able to view lab/test results, encounter notes, upcoming appointments, etc.  Non-urgent messages can be sent to your provider as well.   To learn more about what you can do with MyChart, go to [email or web address].

## 2023-12-03 ENCOUNTER — Inpatient Hospital Stay

## 2023-12-03 DIAGNOSIS — M25551 Pain in right hip: Secondary | ICD-10-CM | POA: Diagnosis not present

## 2023-12-03 DIAGNOSIS — C61 Malignant neoplasm of prostate: Secondary | ICD-10-CM | POA: Diagnosis not present

## 2023-12-03 DIAGNOSIS — D472 Monoclonal gammopathy: Secondary | ICD-10-CM | POA: Insufficient documentation

## 2023-12-03 LAB — CBC WITH DIFFERENTIAL (CANCER CENTER ONLY)
Abs Immature Granulocytes: 0.02 K/uL (ref 0.00–0.07)
Basophils Absolute: 0 K/uL (ref 0.0–0.1)
Basophils Relative: 1 %
Eosinophils Absolute: 0.2 K/uL (ref 0.0–0.5)
Eosinophils Relative: 2 %
HCT: 42.8 % (ref 39.0–52.0)
Hemoglobin: 14.4 g/dL (ref 13.0–17.0)
Immature Granulocytes: 0 %
Lymphocytes Relative: 27 %
Lymphs Abs: 1.8 K/uL (ref 0.7–4.0)
MCH: 30.8 pg (ref 26.0–34.0)
MCHC: 33.6 g/dL (ref 30.0–36.0)
MCV: 91.6 fL (ref 80.0–100.0)
Monocytes Absolute: 0.6 K/uL (ref 0.1–1.0)
Monocytes Relative: 9 %
Neutro Abs: 4 K/uL (ref 1.7–7.7)
Neutrophils Relative %: 61 %
Platelet Count: 175 K/uL (ref 150–400)
RBC: 4.67 MIL/uL (ref 4.22–5.81)
RDW: 14.2 % (ref 11.5–15.5)
WBC Count: 6.6 K/uL (ref 4.0–10.5)
nRBC: 0 % (ref 0.0–0.2)

## 2023-12-03 LAB — CMP (CANCER CENTER ONLY)
ALT: 39 U/L (ref 0–44)
AST: 30 U/L (ref 15–41)
Albumin: 4.5 g/dL (ref 3.5–5.0)
Alkaline Phosphatase: 69 U/L (ref 38–126)
Anion gap: 9 (ref 5–15)
BUN: 25 mg/dL — ABNORMAL HIGH (ref 8–23)
CO2: 24 mmol/L (ref 22–32)
Calcium: 11 mg/dL — ABNORMAL HIGH (ref 8.9–10.3)
Chloride: 107 mmol/L (ref 98–111)
Creatinine: 1.57 mg/dL — ABNORMAL HIGH (ref 0.61–1.24)
GFR, Estimated: 45 mL/min — ABNORMAL LOW (ref >60)
Glucose, Bld: 98 mg/dL (ref 70–99)
Potassium: 4.4 mmol/L (ref 3.5–5.1)
Sodium: 140 mmol/L (ref 135–145)
Total Bilirubin: 0.4 mg/dL (ref 0.0–1.2)
Total Protein: 7.5 g/dL (ref 6.5–8.1)

## 2023-12-04 LAB — KAPPA/LAMBDA LIGHT CHAINS
Kappa free light chain: 139 mg/L — ABNORMAL HIGH (ref 3.3–19.4)
Kappa, lambda light chain ratio: 8.22 — ABNORMAL HIGH (ref 0.26–1.65)
Lambda free light chains: 16.9 mg/L (ref 5.7–26.3)

## 2023-12-04 LAB — IGG, IGA, IGM
IgA: 133 mg/dL (ref 61–437)
IgG (Immunoglobin G), Serum: 1307 mg/dL (ref 603–1613)
IgM (Immunoglobulin M), Srm: 12 mg/dL — ABNORMAL LOW (ref 15–143)

## 2023-12-06 LAB — PROTEIN ELECTROPHORESIS, SERUM, WITH REFLEX
A/G Ratio: 1.2 (ref 0.7–1.7)
Albumin ELP: 3.8 g/dL (ref 2.9–4.4)
Alpha-1-Globulin: 0.2 g/dL (ref 0.0–0.4)
Alpha-2-Globulin: 0.8 g/dL (ref 0.4–1.0)
Beta Globulin: 1 g/dL (ref 0.7–1.3)
Gamma Globulin: 1.2 g/dL (ref 0.4–1.8)
Globulin, Total: 3.2 g/dL (ref 2.2–3.9)
M-Spike, %: 0.6 g/dL — ABNORMAL HIGH
SPEP Interpretation: 0
Total Protein ELP: 7 g/dL (ref 6.0–8.5)

## 2023-12-06 LAB — IMMUNOFIXATION REFLEX, SERUM
IgA: 132 mg/dL (ref 61–437)
IgG (Immunoglobin G), Serum: 1279 mg/dL (ref 603–1613)
IgM (Immunoglobulin M), Srm: 15 mg/dL (ref 15–143)

## 2023-12-10 ENCOUNTER — Ambulatory Visit: Payer: Self-pay

## 2023-12-16 NOTE — Assessment & Plan Note (Signed)
 Kappa 4-5% in BM from 2025. Stable labs Repeat labs in about 6 months, and follow up 2 weeks after blood draws.

## 2023-12-16 NOTE — Progress Notes (Unsigned)
 [facility] OFFICE PROGRESS NOTE  Patient Care Team: Valentin Skates, DO as PCP - General (Internal Medicine) [place] Newman PARAS, MD as PCP - Cardiology (Cardiology)  Telephone visit requested per patient. Physician location: [facility] Patient location: Home Total time for the encounter: 16 minutes  Danny Duran is a 78 y.o.male with history of HTN, HLD, gout, prostate cancer T1c (3+4)GG2 s/p brachytherapy being follow-up for MGUS.    He has low level M proteins reported once. Repeat testing reported IgG monoclonal protein with kappa light chain and kappa was elevated.  Borderline renal insufficiency and hypercalcemia.  Myeloma bone survey showed rounded areas of relative lucency within the C5 and C6 vertebral bodies are indeterminate. Lytic lesions at these locations is not excluded. MR C spine was done and reported heterogeneous bone marrow without focal destructive bone lesion.     Bone marrow was done in 02/2023 and showed mildly hypercellular bone marrow with otherwise orderly trilineage hematopoiesis. Kappa predominant borderline plasmacytosis suggestive of a plasma cell neoplasm approximately 4 to 5%. FISH normal and negative for MM panel.    Discussed results today. MGUS level appears stable.  Patient report new right hip pain.  Will obtain x-ray.  If arthritis, will follow-up with PCP.  Follow up in about 6 months, earlier if needed. Discuss if unable to urinating, report to ED for evaluation. 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 Stable labs Repeat labs in about 6 months, and follow up 2 weeks after blood draws. Right hip pain X-ray ordered  Orders Placed This Encounter  Procedures   XR HIP UNILAT W OR W/O PELVIS 2-3 VIEWS RIGHT    Reason for Exam (SYMPTOM  OR DIAGNOSIS REQUIRED):   Right hip pain assess for arthritis versus lytic lesion.    Preferred imaging location?:   [facility]   CBC with Differential (Cancer Center Only)    Standing Status:   Future    Expiration Date:   12/16/2024   CMP (Cancer Center only)    Standing Status:   Future    Expiration Date:   12/16/2024   Kappa/lambda light chains    Standing Status:   Future    Expiration Date:   12/16/2024   IgG, IgA, IgM    Standing Status:   Future    Expiration Date:   12/16/2024   Serum protein electrophoresis with reflex    Standing Status:   Future    Expiration Date:   12/16/2024     Danny JAYSON Chihuahua, MD  INTERVAL HISTORY: Patient is being follow-up through telephone visit at his request.  Daughter was over the phone. Overall feeling about the same.  No new medical condition or medication.  No new nausea, decreased appetite, difficulty urinating. Report new pain on the right hip. Report for about a few month. Pain stay on the right hip.    Oncology History  Malignant neoplasm of prostate (HCC)  04/07/2019 Cancer Staging   Staging form: Prostate, AJCC 8th Edition - Clinical stage from 04/07/2019: Stage IIB (cT1c, cN0, cM0, PSA: 5.6, Grade Group: 2) - Signed by Sherwood Rise, PA-C on 05/27/2019   05/27/2019 Initial Diagnosis   Malignant neoplasm of prostate [facility])    Relevant data reviewed during this visit included labs.  New labs ordered.

## 2023-12-17 ENCOUNTER — Inpatient Hospital Stay

## 2023-12-17 DIAGNOSIS — M25551 Pain in right hip: Secondary | ICD-10-CM | POA: Diagnosis not present

## 2023-12-17 DIAGNOSIS — D472 Monoclonal gammopathy: Secondary | ICD-10-CM | POA: Diagnosis not present

## 2023-12-18 ENCOUNTER — Telehealth: Payer: Self-pay

## 2023-12-18 NOTE — Telephone Encounter (Signed)
 Scheduled patient for next appointments. Called and spoke with the patient, discussed the days and times, he is aware.

## 2024-01-30 ENCOUNTER — Other Ambulatory Visit: Payer: Self-pay | Admitting: Cardiology

## 2024-06-02 ENCOUNTER — Inpatient Hospital Stay

## 2024-06-16 ENCOUNTER — Inpatient Hospital Stay
